# Patient Record
Sex: Female | Born: 1975
Health system: Southern US, Community
[De-identification: ages and names within clinical notes are randomized; demographics above are authoritative.]

## PROBLEM LIST (undated history)

## (undated) DIAGNOSIS — I10 Essential (primary) hypertension: Secondary | ICD-10-CM

## (undated) DIAGNOSIS — E039 Hypothyroidism, unspecified: Secondary | ICD-10-CM

## (undated) DIAGNOSIS — E785 Hyperlipidemia, unspecified: Secondary | ICD-10-CM

## (undated) DIAGNOSIS — E119 Type 2 diabetes mellitus without complications: Secondary | ICD-10-CM

## (undated) HISTORY — DX: Type 2 diabetes mellitus without complications: E11.9

## (undated) HISTORY — DX: Essential (primary) hypertension: I10

## (undated) HISTORY — DX: Hyperlipidemia, unspecified: E78.5

## (undated) HISTORY — PX: WISDOM TOOTH EXTRACTION: SHX21

## (undated) HISTORY — DX: Hypothyroidism, unspecified: E03.9

---

## 1999-12-06 ENCOUNTER — Inpatient Hospital Stay (HOSPITAL_COMMUNITY): Admission: AD | Admit: 1999-12-06 | Discharge: 1999-12-09 | Payer: Self-pay | Admitting: Obstetrics and Gynecology

## 2000-02-26 HISTORY — PX: TUBAL LIGATION: SHX77

## 2000-03-28 ENCOUNTER — Ambulatory Visit (HOSPITAL_COMMUNITY): Admission: RE | Admit: 2000-03-28 | Discharge: 2000-03-28 | Payer: Self-pay

## 2001-02-09 ENCOUNTER — Other Ambulatory Visit: Admission: RE | Admit: 2001-02-09 | Discharge: 2001-02-09 | Payer: Self-pay | Admitting: Obstetrics and Gynecology

## 2002-03-15 ENCOUNTER — Other Ambulatory Visit: Admission: RE | Admit: 2002-03-15 | Discharge: 2002-03-15 | Payer: Self-pay | Admitting: Obstetrics and Gynecology

## 2003-03-30 ENCOUNTER — Other Ambulatory Visit: Admission: RE | Admit: 2003-03-30 | Discharge: 2003-03-30 | Payer: Self-pay | Admitting: Obstetrics and Gynecology

## 2004-05-01 ENCOUNTER — Other Ambulatory Visit: Admission: RE | Admit: 2004-05-01 | Discharge: 2004-05-01 | Payer: Self-pay | Admitting: Obstetrics and Gynecology

## 2004-05-21 ENCOUNTER — Ambulatory Visit (HOSPITAL_COMMUNITY): Admission: RE | Admit: 2004-05-21 | Discharge: 2004-05-21 | Payer: Self-pay | Admitting: Internal Medicine

## 2004-12-14 ENCOUNTER — Ambulatory Visit: Payer: Self-pay | Admitting: Internal Medicine

## 2004-12-31 ENCOUNTER — Ambulatory Visit: Payer: Self-pay | Admitting: Internal Medicine

## 2005-06-10 ENCOUNTER — Other Ambulatory Visit: Admission: RE | Admit: 2005-06-10 | Discharge: 2005-06-10 | Payer: Self-pay | Admitting: Obstetrics and Gynecology

## 2005-07-13 IMAGING — US US SOFT TISSUE HEAD/NECK
1 series · 14 of 25 positions shown · non-contrast
Comparison: none

CLINICAL DATA: Right thyroid enlargement. 
 ULTRASOUND OF THE THYROID
 Multiplanar imaging shows overall size of the gland to be upper limits of normal to slightly enlarged.  The right lobe measures 5.0 x 1.4 x 1.6 cm.  The left is 4.5 x 1.3 x 1.3 cm.  
 The gland is generally inhomogeneous.  There is a single discreet nodule demonstrates.  This is an Tekoriene structure in the lower pole on the right in the inferior aspect of the gland.  It is solid and measures 12 x 7 x 9 mm.
 The borderline enlargement and inhomogeneity of the gland suggests that this may be an early multinodular goiter.  One might consider a follow-up exam in 6 months to 1 year to assess stability of the nodule demonstrated.

[Series 1: unknown · 0.09mm/px · 14 of 41 slices shown]
[im 1/41]
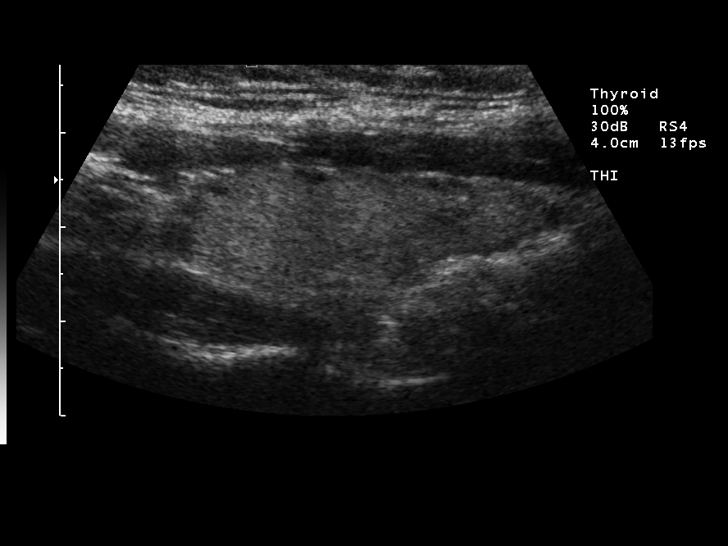
[im 4/41]
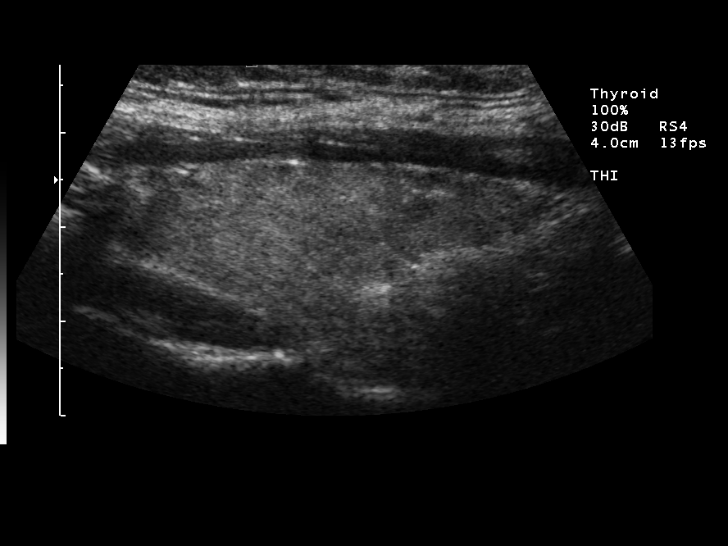
[im 7/41]
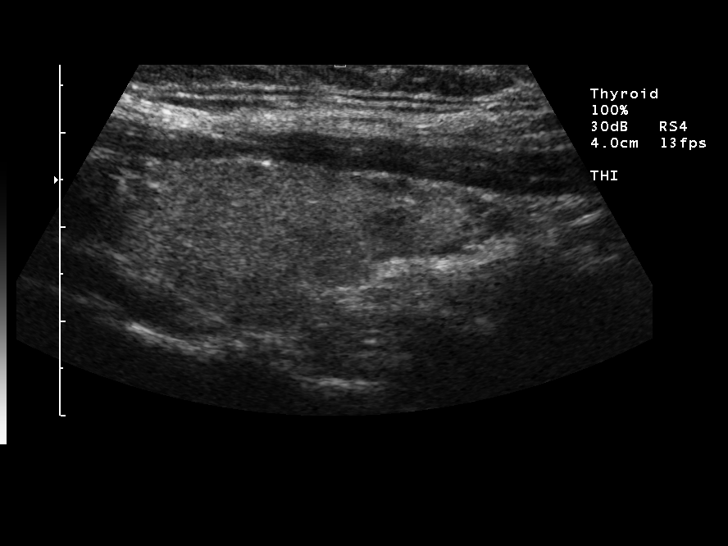
[im 11/41]
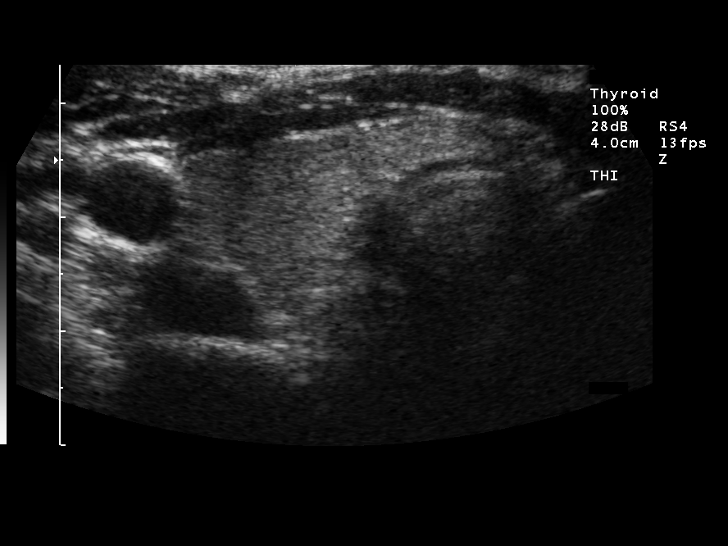
[im 14/41]
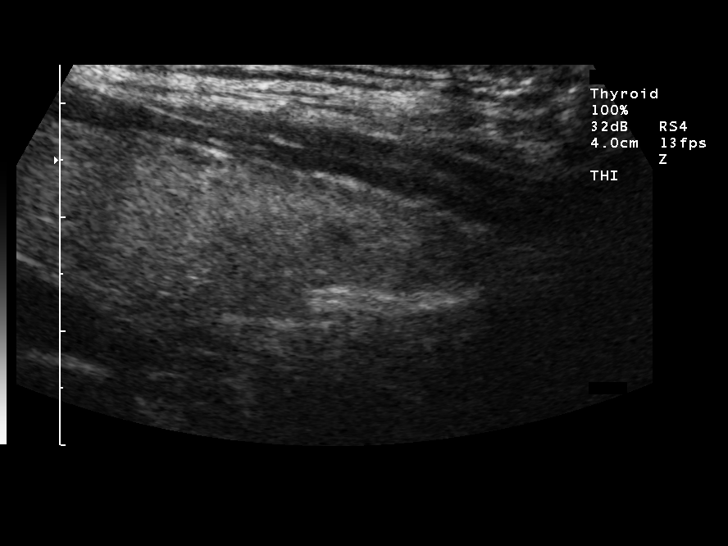
[im 16/41]
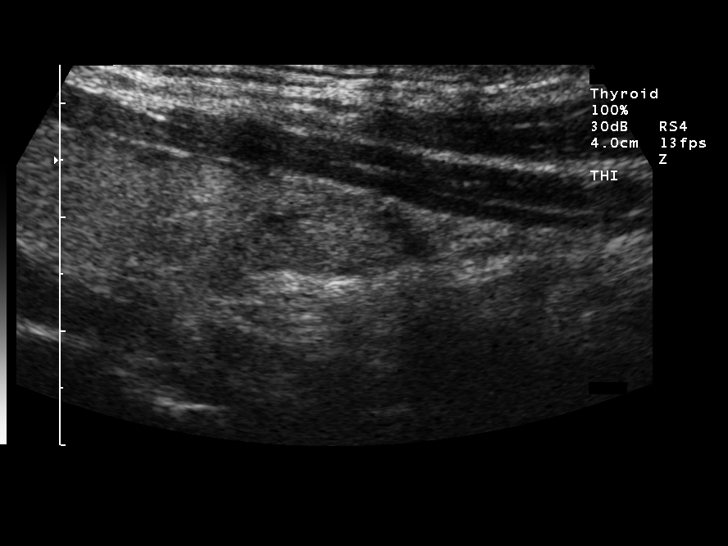
[im 19/41]
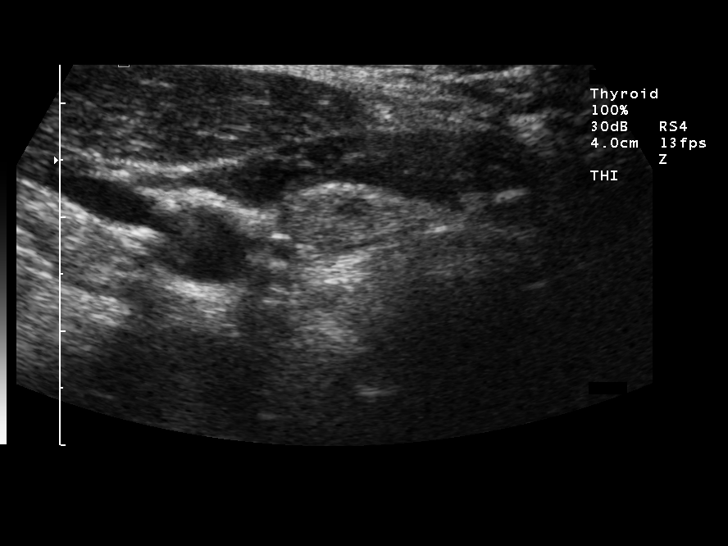
[im 22/41]
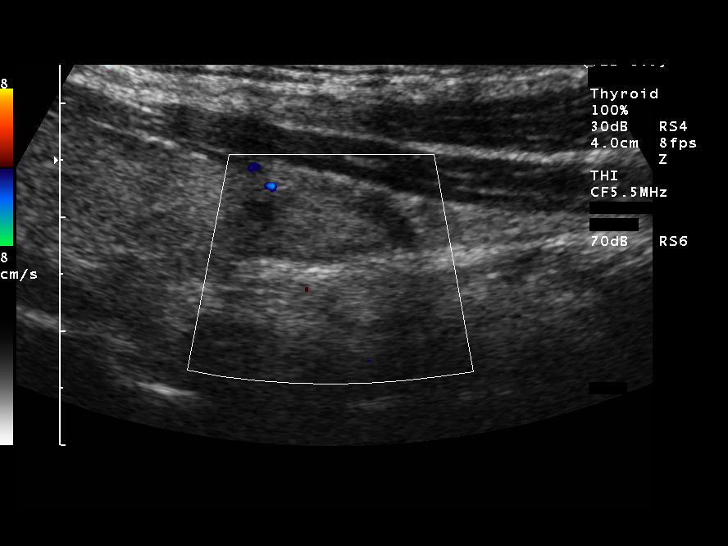
[im 26/41]
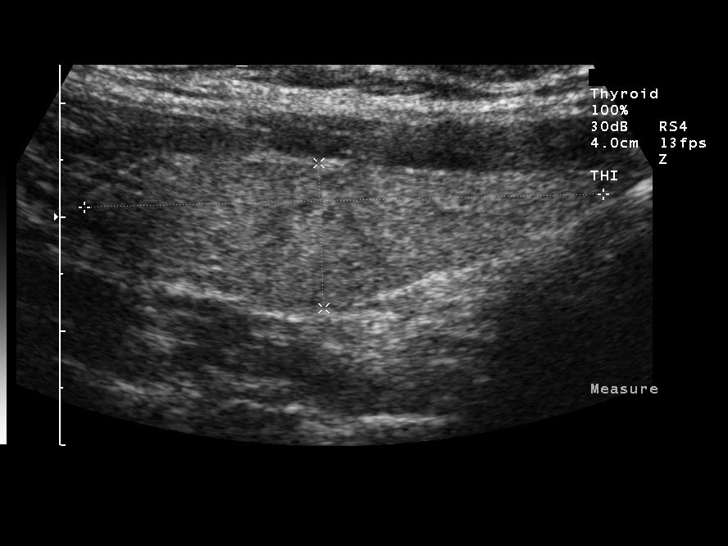
[im 27/41]
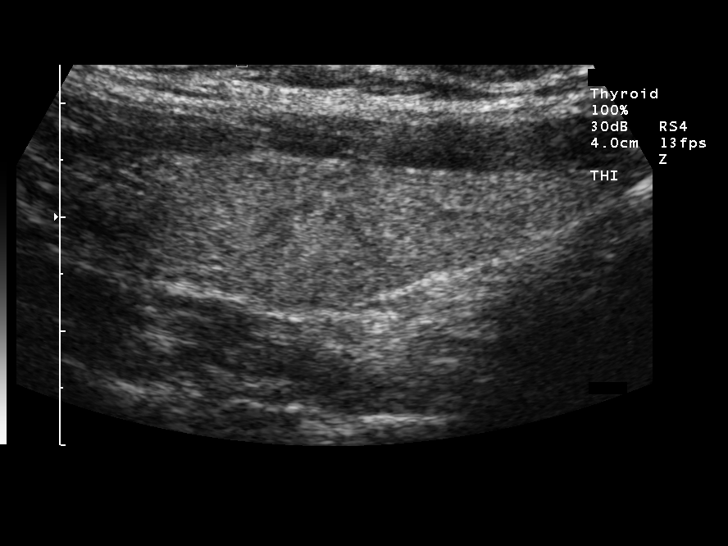
[im 31/41]
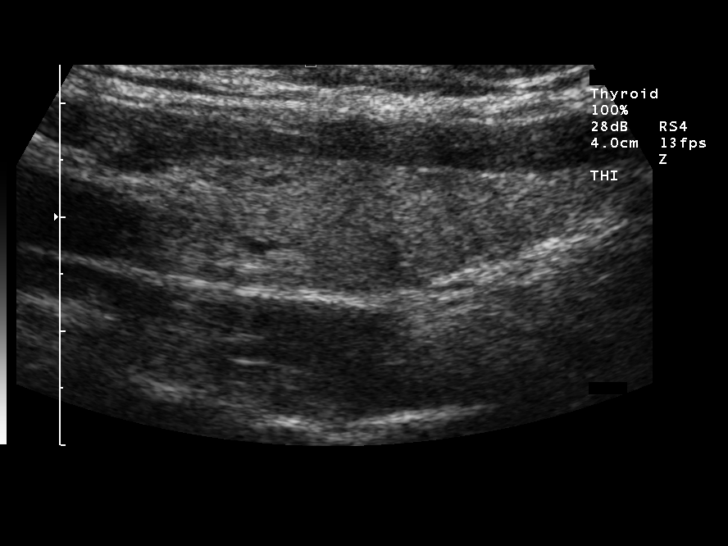
[im 34/41]
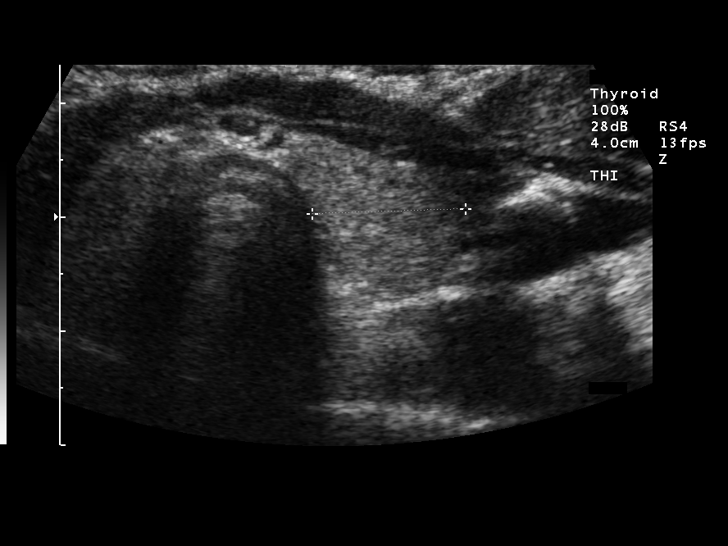
[im 37/41]
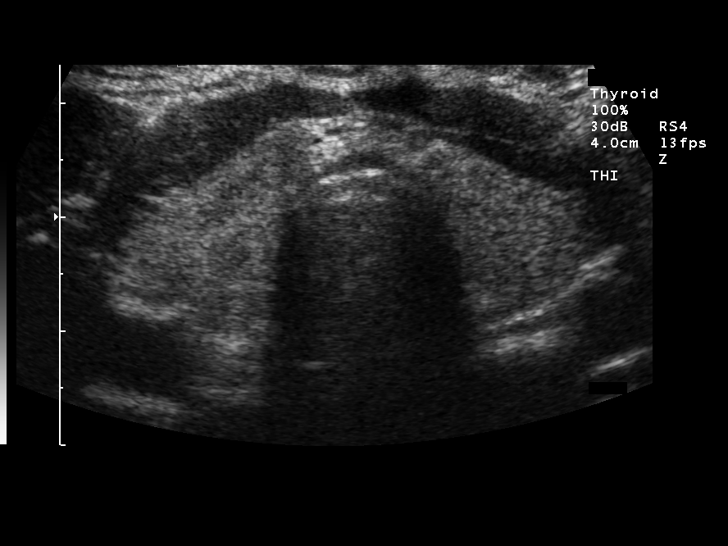
[im 41/41]
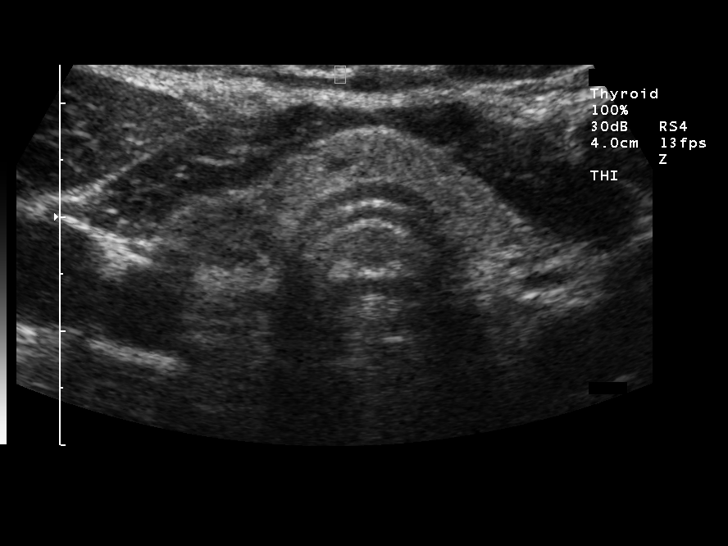

[14 of 25 positions shown; findings below may reference images not displayed]

IMPRESSION: Borderline enlarged, inhomogeneous gland with a single solid nodule in the inferior aspect of the right lobe.  See above discussion.

## 2007-06-10 ENCOUNTER — Ambulatory Visit: Payer: Self-pay | Admitting: Internal Medicine

## 2007-06-10 LAB — CONVERTED CEMR LAB
Alkaline Phosphatase: 44 units/L (ref 39–117)
BUN: 12 mg/dL (ref 6–23)
Basophils Relative: 0.6 % (ref 0.0–1.0)
Calcium: 9.2 mg/dL (ref 8.4–10.5)
Cholesterol: 184 mg/dL (ref 0–200)
GFR calc non Af Amer: 102 mL/min
Glucose, Bld: 108 mg/dL — ABNORMAL HIGH (ref 70–99)
HCT: 40.1 % (ref 36.0–46.0)
HDL: 38.1 mg/dL — ABNORMAL LOW (ref >39.0)
Hemoglobin: 13.8 g/dL (ref 12.0–15.0)
Ketones, ur: NEGATIVE mg/dL
Leukocytes, UA: NEGATIVE
Neutrophils Relative %: 43.1 % (ref 43.0–77.0)
Platelets: 194 10*3/uL (ref 150–400)
RBC: 4.48 M/uL (ref 3.87–5.11)
Sodium: 140 meq/L (ref 135–145)
TSH: 4.34 microintl units/mL (ref 0.35–5.50)
Total Bilirubin: 0.7 mg/dL (ref 0.3–1.2)
Triglycerides: 127 mg/dL (ref 0–149)
Urobilinogen, UA: 0.2 (ref 0.0–1.0)
VLDL: 25 mg/dL (ref 0–40)
pH: 5.5 (ref 5.0–8.0)

## 2007-06-15 ENCOUNTER — Ambulatory Visit: Payer: Self-pay | Admitting: Internal Medicine

## 2008-10-12 ENCOUNTER — Ambulatory Visit: Payer: Self-pay | Admitting: Internal Medicine

## 2008-10-12 DIAGNOSIS — L255 Unspecified contact dermatitis due to plants, except food: Secondary | ICD-10-CM

## 2008-10-18 ENCOUNTER — Telehealth (INDEPENDENT_AMBULATORY_CARE_PROVIDER_SITE_OTHER): Payer: Self-pay | Admitting: *Deleted

## 2008-11-07 ENCOUNTER — Ambulatory Visit: Payer: Self-pay | Admitting: Internal Medicine

## 2008-11-07 DIAGNOSIS — I10 Essential (primary) hypertension: Secondary | ICD-10-CM | POA: Insufficient documentation

## 2008-11-09 ENCOUNTER — Ambulatory Visit: Payer: Self-pay | Admitting: Internal Medicine

## 2008-11-10 LAB — CONVERTED CEMR LAB
AST: 23 units/L (ref 0–37)
Albumin: 3.9 g/dL (ref 3.5–5.2)
Alkaline Phosphatase: 51 units/L (ref 39–117)
BUN: 13 mg/dL (ref 6–23)
Basophils Absolute: 0.3 10*3/uL — ABNORMAL HIGH (ref 0.0–0.1)
Basophils Relative: 3.3 % — ABNORMAL HIGH (ref 0.0–3.0)
Bilirubin, Direct: 0.1 mg/dL (ref 0.0–0.3)
Chloride: 105 meq/L (ref 96–112)
Crystals: NEGATIVE
Glucose, Bld: 114 mg/dL — ABNORMAL HIGH (ref 70–99)
HDL: 53.5 mg/dL (ref >39.0)
Hemoglobin: 13.3 g/dL (ref 12.0–15.0)
Hgb A1c MFr Bld: 5.8 % (ref 4.6–6.0)
Leukocytes, UA: NEGATIVE
Monocytes Relative: 9.1 % (ref 3.0–12.0)
Neutro Abs: 4.3 10*3/uL (ref 1.4–7.7)
Potassium: 4.3 meq/L (ref 3.5–5.1)
RBC: 4.07 M/uL (ref 3.87–5.11)
RDW: 12.2 % (ref 11.5–14.6)
TSH: 4.65 microintl units/mL (ref 0.35–5.50)
Total CHOL/HDL Ratio: 3.6
Total Protein, Urine: 30 mg/dL — AB
Total Protein: 7.1 g/dL (ref 6.0–8.3)
Triglycerides: 53 mg/dL (ref 0–149)
Urobilinogen, UA: 0.2 (ref 0.0–1.0)
WBC: 9.2 10*3/uL (ref 4.5–10.5)
pH: 5.5 (ref 5.0–8.0)

## 2008-12-07 ENCOUNTER — Telehealth (INDEPENDENT_AMBULATORY_CARE_PROVIDER_SITE_OTHER): Payer: Self-pay | Admitting: *Deleted

## 2009-01-10 ENCOUNTER — Encounter: Payer: Self-pay | Admitting: Internal Medicine

## 2009-05-18 ENCOUNTER — Telehealth (INDEPENDENT_AMBULATORY_CARE_PROVIDER_SITE_OTHER): Payer: Self-pay | Admitting: *Deleted

## 2010-12-24 ENCOUNTER — Ambulatory Visit (INDEPENDENT_AMBULATORY_CARE_PROVIDER_SITE_OTHER): Payer: 59 | Admitting: Internal Medicine

## 2010-12-24 ENCOUNTER — Other Ambulatory Visit: Payer: Self-pay | Admitting: Internal Medicine

## 2010-12-24 ENCOUNTER — Encounter: Payer: Self-pay | Admitting: Internal Medicine

## 2010-12-24 DIAGNOSIS — Z0000 Encounter for general adult medical examination without abnormal findings: Secondary | ICD-10-CM

## 2010-12-24 DIAGNOSIS — R7309 Other abnormal glucose: Secondary | ICD-10-CM

## 2010-12-24 DIAGNOSIS — E785 Hyperlipidemia, unspecified: Secondary | ICD-10-CM

## 2010-12-25 LAB — URINALYSIS
Bilirubin Urine: NEGATIVE
Ketones, ur: NEGATIVE
Nitrite: NEGATIVE
Specific Gravity, Urine: >=1.03 (ref 1.000–1.030)
Total Protein, Urine: NEGATIVE
Urine Glucose: NEGATIVE
pH: 5.5 (ref 5.0–8.0)

## 2010-12-25 LAB — HEPATIC FUNCTION PANEL
ALT: 68 U/L — ABNORMAL HIGH (ref 0–35)
Alkaline Phosphatase: 58 U/L (ref 39–117)
Bilirubin, Direct: 0 mg/dL (ref 0.0–0.3)
Total Bilirubin: 0.5 mg/dL (ref 0.3–1.2)

## 2010-12-25 LAB — CBC WITH DIFFERENTIAL/PLATELET
Basophils Absolute: 0 10*3/uL (ref 0.0–0.1)
Basophils Relative: 0.3 % (ref 0.0–3.0)
Lymphs Abs: 2.8 10*3/uL (ref 0.7–4.0)
MCHC: 34.9 g/dL (ref 30.0–36.0)
MCV: 92.6 fl (ref 78.0–100.0)
Neutro Abs: 4.2 10*3/uL (ref 1.4–7.7)
Neutrophils Relative %: 53.2 % (ref 43.0–77.0)
Platelets: 186 10*3/uL (ref 150.0–400.0)
RDW: 12.5 % (ref 11.5–14.6)

## 2010-12-25 LAB — LIPID PANEL
HDL: 40 mg/dL (ref >39.00)
Total CHOL/HDL Ratio: 5

## 2010-12-25 LAB — BASIC METABOLIC PANEL
Creatinine, Ser: 0.8 mg/dL (ref 0.4–1.2)
GFR: 91.08 mL/min (ref >60.00)

## 2011-01-03 NOTE — Assessment & Plan Note (Signed)
Summary: FU ON MEDS /NWS   Vital Signs:  Patient profile:   35 year old female Height:      64 inches Weight:      246 pounds BMI:     42.38 O2 Sat:      98 % on Room air Temp:     98.4 degrees F oral Pulse rate:   83 / minute BP sitting:   120 / 90  (right arm) Cuff size:   large  Vitals Entered By: Zella Ball Ewing CMA Duncan Dull) (December 24, 2010 8:18 AM)  O2 Flow:  Room air  Preventive Care Screening     declines tetanus  CC: followup on medications/RE   CC:  followup on medications/RE.  History of Present Illness: here for wellness and f/u - overall doing ok, but BP at home mild > 140.90;  Pt denies CP, worsening sob, doe, wheezing, orthopnea, pnd, worsening LE edema, palps, dizziness or syncope  Pt denies new neuro symptoms such as headache, facial or extremity weakness  Pt denies polydipsia, polyuria,  Overall good compliance with meds, trying to follow low chol diet, wt stable, little excercise however . Overall good compliance with meds, and good tolerability. No fever, wt loss, night sweats, loss of appetite or other constitutional symptoms  Denies worsening depressive symptoms, suicidal ideation, or panic.   Pt states good ability with ADL's, low fall risk, home safety reviewed and adequate, no significant change in hearing or vision, trying to follow lower chol diet, and occasionally active only with regular excercise.   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 1po Once Daily 2)  Amlodipine Besylate 5 Mg Tabs (Amlodipine Besylate) .Marland Kitchen.. 1 By Mouth Once Daily  Current Medications (verified): 1)  Azor 5-20 Mg Tabs (Amlodipine-Olmesartan) .Marland Kitchen.. 1po Once Daily  Allergies (verified): No Known Drug Allergies  Past History:  Past Medical History: Last updated:  11/07/2008 Hypertension  Past Surgical History: Last updated: 11/07/2008 Tubal ligation - 5/01  Family History: Last updated: 11/07/2008 HTN - grandmother , and pituitary tumore  Social History: Last updated: 10/12/2008 Married Never Smoked Alcohol use-no Drug use-no Regular exercise-no  Risk Factors: Exercise: no (10/12/2008)  Risk Factors: Smoking Status: never (10/12/2008) Passive Smoke Exposure: no (10/12/2008)  Review of Systems  The patient denies anorexia, fever, vision loss, decreased hearing, hoarseness, chest pain, syncope, dyspnea on exertion, peripheral edema, prolonged cough, headaches, hemoptysis, abdominal pain, melena, hematochezia, severe indigestion/heartburn, hematuria, muscle weakness, suspicious skin lesions, transient blindness, difficulty walking, depression, unusual weight change, abnormal bleeding, enlarged lymph nodes, and angioedema.         all otherwise negative per pt -    Physical Exam  General:  alert and overweight-appearing.   Head:  Normocephalic and atraumatic without obvious abnormalities. No apparent alopecia or balding. Eyes:  No corneal or conjunctival inflammation noted. EOMI. Perrla. Ears:  R ear normal and L ear normal.   Nose:  no external deformity and no nasal discharge.   Mouth:  no gingival abnormalities and pharynx pink and moist.   Neck:  supple and no masses.   Lungs:  normal respiratory effort and normal breath sounds.   Heart:  Normal rate and regular rhythm. S1 and S2 normal without gallop, murmur, click, rub or other extra sounds. Abdomen:  soft, non-tender, and normal bowel sounds.  Msk:  no joint tenderness and no joint swelling.   Extremities:  no edema, no erythema  Neurologic:  strength normal in all extremities and gait normal.   Skin:  color normal and no rashes.   Psych:  not depressed appearing and slightly anxious.     Impression & Recommendations:  Problem # 1:  Preventive Health Care  (ICD-V70.0) Overall doing well, age appropriate education and counseling updated, referral for preventive services and immunizations addressed, dietary counseling and smoking status adressed , most recent labs reviewed I have personally reviewed and have noted 1.The patient's medical and social history 2.Their use of alcohol, tobacco or illicit drugs 3.Their current medications and supplements 4. Functional ability including ADL's, fall risk, home safety risk, hearing & visual impairment  5.Diet and physical activities 6.Evidence for depression or mood disorders The patients weight, height, BMI  have been recorded in the chart I have made referrals, counseling and provided education to the patient based review of the above  Orders: TLB-BMP (Basic Metabolic Panel-BMET) (80048-METABOL) TLB-CBC Platelet - w/Differential (85025-CBCD) TLB-Hepatic/Liver Function Pnl (80076-HEPATIC) TLB-Lipid Panel (80061-LIPID) TLB-TSH (Thyroid Stimulating Hormone) (84443-TSH) TLB-Udip ONLY (81003-UDIP)  Problem # 2:  HYPERTENSION (ICD-401.9)  The following medications were removed from the medication list:    Cozaar 100 Mg Tabs (Losartan potassium) .Marland Kitchen... 1po once daily Her updated medication list for this problem includes:    Azor 5-20 Mg Tabs (Amlodipine-olmesartan) .Marland Kitchen... 1po once daily  BP today: 120/90 Prior BP: 136/100 (11/07/2008)  Labs Reviewed: K+: 4.3 (11/09/2008) Creat: : 0.8 (11/09/2008)   Chol: 190 (11/09/2008)   HDL: 53.5 (11/09/2008)   LDL: 126 (11/09/2008)   TG: 53 (11/09/2008) stable overall by hx and exam, ok to continue meds/tx as is   Complete Medication List: 1)  Azor 5-20 Mg Tabs (Amlodipine-olmesartan) .Marland Kitchen.. 1po once daily  Patient Instructions: 1)  Please take all new medications as prescribed 2)  Continue all previous medications as before this visit  3)  Please go to the Lab in the basement for your blood and/or urine tests today 4)  Please call the number on the [facility]  for results of your testing  5)  Please schedule a follow-up appointment in 3 months. 6)  Check your Blood Pressure regularly. If it is above 140.90: you should make an appointment or call sooner Prescriptions: AZOR 5-20 MG TABS (AMLODIPINE-OLMESARTAN) 1po once daily  #30 x 11   Entered and Authorized by:   Corwin Levins MD   Signed by:   Corwin Levins MD on 12/24/2010   Method used:   Print then Give to Patient   RxID:   [id number] AZOR 5-20 MG TABS (AMLODIPINE-OLMESARTAN) 1po once daily  #90 x 3   Entered and Authorized by:   Corwin Levins MD   Signed by:   Corwin Levins MD on 12/24/2010   Method used:   Print then Give to Patient   RxID:   [id number]    Orders Added: 1)  TLB-BMP (Basic Metabolic Panel-BMET) [80048-METABOL] 2)  TLB-CBC Platelet - w/Differential [85025-CBCD] 3)  TLB-Hepatic/Liver Function Pnl [80076-HEPATIC] 4)  TLB-Lipid Panel [80061-LIPID] 5)  TLB-TSH (Thyroid Stimulating Hormone) [84443-TSH] 6)  TLB-Udip ONLY [81003-UDIP] 7)  Est. Patient 18-39 years [95284]

## 2011-03-15 NOTE — Op Note (Signed)
[facility]  Patient:    Michelle Davis, POZO                    MRN: [id number] Proc. Date: 03/28/00 Adm. Date:  [id number] Disc. Date: [id number] Attending:  Cordelia Pen Ii                           Operative Report  PREOPERATIVE DIAGNOSIS:       Desires permanent sterilization.  POSTOPERATIVE DIAGNOSIS:      Desires permanent sterilization.  OPERATION:                    Laparoscopy with Filshie clip application.  SURGEON:                      Guy Sandifer. Arleta [place], M.D.  ASSISTANT:  ANESTHESIA:                   General endotracheal anesthesia by Gretta Cool., M.D.  ESTIMATED BLOOD LOSS:         Drops.  INDICATIONS:                  This patient is a 35 year old married white female, gravida 2, para 2, who desires permanent sterilization.  Details are dictated in the history and physical.  Laparoscopy with Filshie clip application is discussed. Possible risks and complications are discussed including, but not limited to, infection, bowel, bladder, or ureteral damage, bleeding requiring transfusion of blood products with possible transfusion reaction, HIV, and hepatitis acquisition, DVT, PE, failure rate, increased ectopic risk, and permanence of the procedure.  Consent is signed and on the chart.  FINDINGS:                     Upper abdomen is normal.  The uterus, tubes, ovaries, anterior and posterior cul-de-sac, and side walls are normal.  DESCRIPTION OF PROCEDURE:     The patient is taken to the operating room and placed in the dorsal supine position where general anesthesia is induced via endotracheal intubation.  She is then placed in the dorsal lithotomy position where she is prepped abdominally and vaginally.  The bladder is straight catheterized. Hulka tenaculum was placed in the uterus as a manipulator and she is draped in a sterile fashion.  A small infraumbilical incision is made and a 10/11 disposable  trocar  sleeve is placed on the first attempt without difficulty.  Placement was verified with the laparoscope and no damage to surrounding structures was noted.  The pneumoperitoneum was induced.  A small suprapubic incision was made and a 5 mm nondisposable trocar sleeve is placed under direct visualization without difficulty.  The above findings were noted.  The Filshie clip applicator is then backloaded through the operative laparoscope and the right fallopian tube is identified from cornua to fimbria and grasped and elevated.  Filshie clip is then placed on the proximal segment of the fallopian tube.  A similar procedure is carried out on the left fallopian tube.  Filshie clip applicator is removed and  close inspection reveals that the clips surround the entire width of the tube and the heel of the clip can be seen through the mesosalpinx bilaterally.  The suprapubic trocar sleeve is removed, the pneumoperitoneum is reduced, and no bleeding is noted from any site.  The pneumoperitoneum is completely reduced. Umbilical trocar sleeve is removed,  and the skin incisions are closed with subcuticular 3-0 Vicryl suture.  The incisions are injected with 0.5% plain Marcaine, dressings are applied, Hulka tenaculum is removed, and no bleeding is  noted.  All counts are correct.  The patient is transferred to the recovery room in stable condition. DD:  03/28/00 TD:  03/31/00 Job: [id number]

## 2012-01-03 ENCOUNTER — Other Ambulatory Visit: Payer: Self-pay | Admitting: Internal Medicine

## 2012-01-07 ENCOUNTER — Encounter: Payer: Self-pay | Admitting: Endocrinology

## 2012-01-07 ENCOUNTER — Ambulatory Visit (INDEPENDENT_AMBULATORY_CARE_PROVIDER_SITE_OTHER): Payer: 59 | Admitting: Endocrinology

## 2012-01-07 VITALS — BP 136/92 | HR 75 | Temp 98.3°F | Ht 64.0 in | Wt 250.0 lb

## 2012-01-07 DIAGNOSIS — L255 Unspecified contact dermatitis due to plants, except food: Secondary | ICD-10-CM

## 2012-01-07 MED ORDER — METHYLPREDNISOLONE SODIUM SUCC 125 MG IJ SOLR
125.0000 mg | Freq: Once | INTRAMUSCULAR | Status: AC
  Administered 2012-01-07: 125 mg via INTRAMUSCULAR

## 2012-01-07 MED ORDER — TRIAMCINOLONE ACETONIDE 0.1 % EX CREA: TOPICAL_CREAM | Freq: Three times a day (TID) | CUTANEOUS | Status: AC

## 2012-01-07 NOTE — Patient Instructions (Signed)
i have sent a prescription to your pharmacy, for a steroid cream. I hope you feel better soon.  If you don't feel better by next week, please call back.

## 2012-01-07 NOTE — Progress Notes (Signed)
  Subjective:    Patient ID: Michelle Davis, female    DOB: 1976-04-08, 36 y.o.   MRN: [id number]  HPI Pt states few days of slight rash at the forearms and forehead, and assoc itching.   Past Medical History  Diagnosis Date  . Hypertension     Past Surgical History  Procedure Date  . Tubal ligation 02/2000    History   Social History  . Marital Status: Married    Spouse Name: N/A    Number of Children: N/A  . Years of Education: N/A   Occupational History  . Not on file.   Social History Main Topics  . Smoking status: Never Smoker   . Smokeless tobacco: Not on file  . Alcohol Use: No  . Drug Use: No  . Sexually Active: Not on file   Other Topics Concern  . Not on file   Social History Narrative   Regular exercise-no    Current Outpatient Prescriptions on File Prior to Visit  Medication Sig Dispense Refill  . AZOR 5-20 MG per tablet TAKE 1 TABLET BY MOUTH ONCE DAILY  30 tablet  1    No Known Allergies  Family History  Problem Relation Age of Onset  . Hypertension Other     Grandmother-and pituitary tumor    BP 136/92  Pulse 75  Temp(Src) 98.3 F (36.8 C) (Oral)  Ht 5\' 4"  (1.626 m)  Wt 250 lb (113.399 kg)  BMI 42.91 kg/m2  SpO2 97%  LMP 01/07/2012    Review of Systems Denies fever.    Objective:   Physical Exam VITAL SIGNS:  See vs page.   GENERAL: no distress. Skin: there is a mild eczematous rash at these areas.      Solumedrol 125 mg IM    Assessment & Plan:  Contact dermatitis, new

## 2012-05-06 ENCOUNTER — Other Ambulatory Visit: Payer: Self-pay | Admitting: Internal Medicine

## 2012-05-06 ENCOUNTER — Telehealth: Payer: Self-pay

## 2012-05-06 NOTE — Telephone Encounter (Signed)
Patient called requesting samples of BP med awaiting PA to be completed. Patient informed to pickup samples today at her convenience. Patient received 8 boxes of Azor 5/20 #7 in each box.

## 2012-05-07 ENCOUNTER — Telehealth: Payer: Self-pay

## 2012-05-07 MED ORDER — IRBESARTAN 150 MG PO TABS: 150.0000 mg | ORAL_TABLET | Freq: Every day | ORAL | Status: DC

## 2012-05-07 MED ORDER — AMLODIPINE BESYLATE 5 MG PO TABS: 5.0000 mg | ORAL_TABLET | Freq: Every day | ORAL | Status: DC

## 2012-05-07 NOTE — Telephone Encounter (Signed)
Received PA for Azor 5/20 please advise if to proceed or offer alternative.  Patient does have samples of the Azor at this time as she was completely out.

## 2012-05-07 NOTE — Telephone Encounter (Signed)
Ok to change to amlodipine 5, and avapro 150 qd - both generic (which together is approx equal to the azor 5-20)

## 2012-05-08 NOTE — Telephone Encounter (Signed)
Patient informed. 

## 2012-05-08 NOTE — Telephone Encounter (Signed)
Called left message to call back 

## 2012-09-16 ENCOUNTER — Ambulatory Visit (INDEPENDENT_AMBULATORY_CARE_PROVIDER_SITE_OTHER): Payer: 59 | Admitting: Internal Medicine

## 2012-09-16 ENCOUNTER — Encounter: Payer: Self-pay | Admitting: Internal Medicine

## 2012-09-16 VITALS — BP 120/86 | HR 88 | Temp 97.7°F | Ht 63.0 in | Wt 248.0 lb

## 2012-09-16 DIAGNOSIS — M545 Low back pain, unspecified: Secondary | ICD-10-CM | POA: Insufficient documentation

## 2012-09-16 DIAGNOSIS — Z0000 Encounter for general adult medical examination without abnormal findings: Secondary | ICD-10-CM | POA: Insufficient documentation

## 2012-09-16 DIAGNOSIS — Z0001 Encounter for general adult medical examination with abnormal findings: Secondary | ICD-10-CM | POA: Insufficient documentation

## 2012-09-16 LAB — POCT URINALYSIS DIPSTICK
Ketones, UA: NEGATIVE
Leukocytes, UA: NEGATIVE
Urobilinogen, UA: 0.2
pH, UA: 7

## 2012-09-16 MED ORDER — CYCLOBENZAPRINE HCL 5 MG PO TABS: 5.0000 mg | ORAL_TABLET | Freq: Three times a day (TID) | ORAL | Status: DC | PRN

## 2012-09-16 MED ORDER — PREDNISONE 10 MG PO TABS: ORAL_TABLET | ORAL | Status: DC

## 2012-09-16 MED ORDER — TRAMADOL HCL 50 MG PO TABS: 50.0000 mg | ORAL_TABLET | Freq: Four times a day (QID) | ORAL | Status: DC | PRN

## 2012-09-16 NOTE — Patient Instructions (Addendum)
Take all new medications as prescribed Continue all other medications as before Please go to LAB in the Basement for the blood and/or urine tests to be done at your convenience You will be contacted by phone if any changes need to be made immediately.  Otherwise, you will receive a letter about your results with an explanation, but please check with MyChart first. Thank you for enrolling in MyChart. Please follow the instructions below to securely access your online medical record. MyChart allows you to send messages to your doctor, view your test results, renew your prescriptions, schedule appointments, and more. To Log into MyChart, please go to [email or web address], and your Username is: [phone]

## 2012-09-16 NOTE — Assessment & Plan Note (Signed)
C/w msk strain most likely, for pain med/flexeril/predpack asd as cannot r/o underlying inflammation

## 2012-09-16 NOTE — Progress Notes (Signed)
Subjective:    Patient ID: Michelle Davis, female    DOB: July 05, 1976, 36 y.o.   MRN: [id number]  HPI Here for wellness and f/u;  Overall doing ok;  Pt denies CP, worsening SOB, DOE, wheezing, orthopnea, PND, worsening LE edema, palpitations, dizziness or syncope.  Pt denies neurological change such as new Headache, facial or extremity weakness.  Pt denies polydipsia, polyuria, or low sugar symptoms. Pt states overall good compliance with treatment and medications, good tolerability, and trying to follow lower cholesterol diet.  Pt denies worsening depressive symptoms, suicidal ideation or panic. No fever, wt loss, night sweats, loss of appetite, or other constitutional symptoms.  Pt states good ability with ADL's, low fall risk, home safety reviewed and adequate, no significant changes in hearing or vision, and occasionally active with exercise.  Pt c/o 2-3 days recurring left LBP without bowel or bladder change, fever, wt loss,  worsening LE pain/numbness/weakness, gait change or falls.  No prior hx of similar.  Worse to stand.  Has been lifting the grandchild much over the last few months.  C/o increaseed stress recently.   Denies urinary symptoms such as dysuria, frequency, urgency,or hematuria. Past Medical History  Diagnosis Date  . Hypertension    Past Surgical History  Procedure Date  . Tubal ligation 02/2000    reports that she has never smoked. She does not have any smokeless tobacco history on file. She reports that she does not drink alcohol or use illicit drugs. family history includes Hypertension in her other. No Known Allergies Current Outpatient Prescriptions on File Prior to Visit  Medication Sig Dispense Refill  . amLODipine (NORVASC) 5 MG tablet Take 1 tablet (5 mg total) by mouth daily.  90 tablet  3  . irbesartan (AVAPRO) 150 MG tablet Take 1 tablet (150 mg total) by mouth daily.  90 tablet  3  . triamcinolone cream (KENALOG) 0.1 % Apply topically 3 (three) times daily. As  needed for itching  30 g  0   Review of Systems Review of Systems  Constitutional: Negative for diaphoresis, activity change, appetite change and unexpected weight change.  HENT: Negative for hearing loss, ear pain, facial swelling, mouth sores and neck stiffness.   Eyes: Negative for pain, redness and visual disturbance.  Respiratory: Negative for shortness of breath and wheezing.   Cardiovascular: Negative for chest pain and palpitations.  Gastrointestinal: Negative for diarrhea, blood in stool, abdominal distention and rectal pain.  Genitourinary: Negative for hematuria, flank pain and decreased urine volume.  Musculoskeletal: Negative for myalgias and joint swelling.  Skin: Negative for color change and wound.  Neurological: Negative for syncope and numbness.  Hematological: Negative for adenopathy.  Psychiatric/Behavioral: Negative for hallucinations, self-injury, decreased concentration and agitation.      Objective:   Physical Exam BP 120/86  Pulse 88  Temp 97.7 F (36.5 C) (Oral)  Ht 5\' 3"  (1.6 m)  Wt 248 lb (112.492 kg)  BMI 43.93 kg/m2  SpO2 98% Physical Exam  VS noted Constitutional: Pt is oriented to person, place, and time. Appears well-developed and well-nourished.  HENT:  Head: Normocephalic and atraumatic.  Right Ear: External ear normal.  Left Ear: External ear normal.  Nose: Nose normal.  Mouth/Throat: Oropharynx is clear and moist.  Eyes: Conjunctivae and EOM are normal. Pupils are equal, round, and reactive to light.  Neck: Normal range of motion. Neck supple. No JVD present. No tracheal deviation present.  Cardiovascular: Normal rate, regular rhythm, normal heart sounds and intact distal  pulses.   Pulmonary/Chest: Effort normal and breath sounds normal.  Abdominal: Soft. Bowel sounds are normal. There is no tenderness.  Musculoskeletal: Normal range of motion. Exhibits no edema.  Lymphadenopathy:  Has no cervical adenopathy.  Neurological: Pt is alert  and oriented to person, place, and time. Pt has normal reflexes. No cranial nerve deficit. Motor/dtr/sens/gait intact Skin: Skin is warm and dry. No rash noted.  Psychiatric:  Has  normal mood and affect. Behavior is normal.  Spine: nontender except for left paravertebral area    Assessment & Plan:

## 2012-09-16 NOTE — Assessment & Plan Note (Signed)

## 2012-12-12 ENCOUNTER — Other Ambulatory Visit: Payer: Self-pay

## 2013-05-28 ENCOUNTER — Other Ambulatory Visit: Payer: Self-pay | Admitting: Internal Medicine

## 2013-09-02 ENCOUNTER — Other Ambulatory Visit: Payer: Self-pay

## 2014-06-22 ENCOUNTER — Other Ambulatory Visit: Payer: Self-pay | Admitting: Internal Medicine

## 2014-06-30 ENCOUNTER — Ambulatory Visit (INDEPENDENT_AMBULATORY_CARE_PROVIDER_SITE_OTHER): Payer: BC Managed Care – PPO | Admitting: Internal Medicine

## 2014-06-30 ENCOUNTER — Other Ambulatory Visit (INDEPENDENT_AMBULATORY_CARE_PROVIDER_SITE_OTHER): Payer: BC Managed Care – PPO

## 2014-06-30 ENCOUNTER — Encounter: Payer: Self-pay | Admitting: Internal Medicine

## 2014-06-30 VITALS — BP 120/82 | HR 83 | Temp 98.6°F | Ht 64.0 in | Wt 255.5 lb

## 2014-06-30 DIAGNOSIS — Z0000 Encounter for general adult medical examination without abnormal findings: Secondary | ICD-10-CM

## 2014-06-30 DIAGNOSIS — R7309 Other abnormal glucose: Secondary | ICD-10-CM

## 2014-06-30 DIAGNOSIS — I10 Essential (primary) hypertension: Secondary | ICD-10-CM

## 2014-06-30 DIAGNOSIS — E785 Hyperlipidemia, unspecified: Secondary | ICD-10-CM | POA: Insufficient documentation

## 2014-06-30 DIAGNOSIS — R7302 Impaired glucose tolerance (oral): Secondary | ICD-10-CM

## 2014-06-30 DIAGNOSIS — J309 Allergic rhinitis, unspecified: Secondary | ICD-10-CM | POA: Insufficient documentation

## 2014-06-30 DIAGNOSIS — E119 Type 2 diabetes mellitus without complications: Secondary | ICD-10-CM | POA: Insufficient documentation

## 2014-06-30 LAB — LIPID PANEL
Cholesterol: 232 mg/dL — ABNORMAL HIGH (ref 0–200)
HDL: 40.4 mg/dL (ref >39.00)
NonHDL: 191.6
TRIGLYCERIDES: 238 mg/dL — AB (ref 0.0–149.0)
Total CHOL/HDL Ratio: 6
VLDL: 47.6 mg/dL — ABNORMAL HIGH (ref 0.0–40.0)

## 2014-06-30 LAB — CBC WITH DIFFERENTIAL/PLATELET
BASOS ABS: 0.1 10*3/uL (ref 0.0–0.1)
BASOS PCT: 0.7 % (ref 0.0–3.0)
EOS PCT: 3.8 % (ref 0.0–5.0)
Eosinophils Absolute: 0.3 10*3/uL (ref 0.0–0.7)
HEMATOCRIT: 41.2 % (ref 36.0–46.0)
HEMOGLOBIN: 13.8 g/dL (ref 12.0–15.0)
LYMPHS ABS: 3.7 10*3/uL (ref 0.7–4.0)
LYMPHS PCT: 42.4 % (ref 12.0–46.0)
MCHC: 33.5 g/dL (ref 30.0–36.0)
MCV: 90.7 fl (ref 78.0–100.0)
MONOS PCT: 9.8 % (ref 3.0–12.0)
Monocytes Absolute: 0.9 10*3/uL (ref 0.1–1.0)
NEUTROS ABS: 3.8 10*3/uL (ref 1.4–7.7)
Neutrophils Relative %: 43.3 % (ref 43.0–77.0)
Platelets: 253 10*3/uL (ref 150.0–400.0)
RBC: 4.55 Mil/uL (ref 3.87–5.11)
RDW: 12.7 % (ref 11.5–15.5)
WBC: 8.8 10*3/uL (ref 4.0–10.5)

## 2014-06-30 LAB — URINALYSIS, ROUTINE W REFLEX MICROSCOPIC
Bilirubin Urine: NEGATIVE
HGB URINE DIPSTICK: NEGATIVE
Ketones, ur: NEGATIVE
NITRITE: NEGATIVE
SPECIFIC GRAVITY, URINE: 1.015 (ref 1.000–1.030)
Total Protein, Urine: NEGATIVE
URINE GLUCOSE: NEGATIVE
Urobilinogen, UA: 0.2 (ref 0.0–1.0)
pH: 8 (ref 5.0–8.0)

## 2014-06-30 LAB — BASIC METABOLIC PANEL
BUN: 9 mg/dL (ref 6–23)
CALCIUM: 9.4 mg/dL (ref 8.4–10.5)
CO2: 26 mEq/L (ref 19–32)
CREATININE: 0.7 mg/dL (ref 0.4–1.2)
Chloride: 104 mEq/L (ref 96–112)
GFR: 96.72 mL/min (ref >60.00)
GLUCOSE: 81 mg/dL (ref 70–99)
Potassium: 3.9 mEq/L (ref 3.5–5.1)
SODIUM: 139 meq/L (ref 135–145)

## 2014-06-30 LAB — TSH: TSH: 5.02 u[IU]/mL — ABNORMAL HIGH (ref 0.35–4.50)

## 2014-06-30 LAB — HEPATIC FUNCTION PANEL
ALBUMIN: 4.2 g/dL (ref 3.5–5.2)
ALT: 44 U/L — ABNORMAL HIGH (ref 0–35)
AST: 35 U/L (ref 0–37)
Alkaline Phosphatase: 57 U/L (ref 39–117)
Bilirubin, Direct: 0.1 mg/dL (ref 0.0–0.3)
Total Bilirubin: 0.6 mg/dL (ref 0.2–1.2)
Total Protein: 7.8 g/dL (ref 6.0–8.3)

## 2014-06-30 LAB — LDL CHOLESTEROL, DIRECT: Direct LDL: 172.9 mg/dL

## 2014-06-30 LAB — HEMOGLOBIN A1C: HEMOGLOBIN A1C: 6.4 % (ref 4.6–6.5)

## 2014-06-30 MED ORDER — FLUTICASONE PROPIONATE 50 MCG/ACT NA SUSP: 2.0000 | Freq: Every day | NASAL | Status: DC

## 2014-06-30 MED ORDER — IRBESARTAN 150 MG PO TABS: 150.0000 mg | ORAL_TABLET | Freq: Every day | ORAL | Status: DC

## 2014-06-30 MED ORDER — AMLODIPINE BESYLATE 5 MG PO TABS: 5.0000 mg | ORAL_TABLET | Freq: Every day | ORAL | Status: DC

## 2014-06-30 NOTE — Progress Notes (Signed)
Pre visit review using our clinic review tool, if applicable. No additional management support is needed unless otherwise documented below in the visit note. 

## 2014-06-30 NOTE — Patient Instructions (Addendum)
Please take all new medication as prescribed - the flonase for allergies  Please continue all other medications as before, and refills have been done if requested.  Please have the pharmacy call with any other refills you may need.  Please continue your efforts at being more active, low cholesterol diet, and weight control.  You are otherwise up to date with prevention measures today.  Please keep your appointments with your specialists as you may have planned  Please go to the LAB in the Basement (turn left off the elevator) for the tests to be done today  You will be contacted by phone if any changes need to be made immediately.  Otherwise, you will receive a letter about your results with an explanation, but please check with MyChart first.  Please remember to sign up for MyChart if you have not done so, as this will be important to you in the future with finding out test results, communicating by private email, and scheduling acute appointments online when needed.  Please return in 1 year for your yearly visit, or sooner if needed, with Lab testing done 3-5 days before

## 2014-06-30 NOTE — Progress Notes (Signed)
Subjective:    Patient ID: Michelle Davis, female    DOB: 16-Mar-1976, 38 y.o.   MRN: [id number]  HPI     Here for wellness and f/u;  Overall doing ok;  Pt denies CP, worsening SOB, DOE, wheezing, orthopnea, PND, worsening LE edema, palpitations, dizziness or syncope.  Pt denies neurological change such as new headache, facial or extremity weakness.  Pt denies polydipsia, polyuria, or low sugar symptoms. Pt states overall good compliance with treatment and medications, good tolerability, and has been trying to follow lower cholesterol diet.  Pt denies worsening depressive symptoms, suicidal ideation or panic. No fever, night sweats, wt loss, loss of appetite, or other constitutional symptoms.  Pt states good ability with ADL's, has low fall risk, home safety reviewed and adequate, no other significant changes in hearing or vision, and only occasionally active with exercise. Does have several wks ongoing nasal allergy symptoms with clearish congestion, itch and sneezing, without fever, pain, ST, cough, swelling or wheezing. No other complaints Past Medical History  Diagnosis Date  . Hypertension    Past Surgical History  Procedure Laterality Date  . Tubal ligation  02/2000    reports that she has never smoked. She does not have any smokeless tobacco history on file. She reports that she does not drink alcohol or use illicit drugs. family history includes Hypertension in her other. No Known Allergies No current outpatient prescriptions on file prior to visit.   No current facility-administered medications on file prior to visit.   Review of Systems Constitutional: Negative for increased diaphoresis, other activity, appetite or other siginficant weight change  HENT: Negative for worsening hearing loss, ear pain, facial swelling, mouth sores and neck stiffness.   Eyes: Negative for other worsening pain, redness or visual disturbance.  Respiratory: Negative for shortness of breath and  wheezing.   Cardiovascular: Negative for chest pain and palpitations.  Gastrointestinal: Negative for diarrhea, blood in stool, abdominal distention or other pain Genitourinary: Negative for hematuria, flank pain or change in urine volume.  Musculoskeletal: Negative for myalgias or other joint complaints.  Skin: Negative for color change and wound.  Neurological: Negative for syncope and numbness. other than noted Hematological: Negative for adenopathy. or other swelling Psychiatric/Behavioral: Negative for hallucinations, self-injury, decreased concentration or other worsening agitation.      Objective:   Physical Exam BP 120/82  Pulse 83  Temp(Src) 98.6 F (37 C) (Oral)  Ht 5\' 4"  (1.626 m)  Wt 255 lb 8 oz (115.894 kg)  BMI 43.83 kg/m2  SpO2 97% VS noted,  Constitutional: Pt is oriented to person, place, and time. Appears well-developed and well-nourished.  Head: Normocephalic and atraumatic.  Right Ear: External ear normal.  Left Ear: External ear normal.  Nose: Nose normal.  Mouth/Throat: Oropharynx is clear and moist.  Eyes: Conjunctivae and EOM are normal. Pupils are equal, round, and reactive to light.  Neck: Normal range of motion. Neck supple. No JVD present. No tracheal deviation present.  Cardiovascular: Normal rate, regular rhythm, normal heart sounds and intact distal pulses.   Pulmonary/Chest: Effort normal and breath sounds without rales or wheezing  Abdominal: Soft. Bowel sounds are normal. NT. No HSM  Musculoskeletal: Normal range of motion. Exhibits no edema.  Lymphadenopathy:  Has no cervical adenopathy.  Neurological: Pt is alert and oriented to person, place, and time. Pt has normal reflexes. No cranial nerve deficit. Motor grossly intact Skin: Skin is warm and dry. No rash noted.  Psychiatric:  Has normal mood  and affect. Behavior is normal.     Assessment & Plan:

## 2014-07-01 ENCOUNTER — Encounter: Payer: Self-pay | Admitting: Internal Medicine

## 2014-07-03 NOTE — Assessment & Plan Note (Signed)
Cont diet, f/u lipids.

## 2014-07-03 NOTE — Assessment & Plan Note (Signed)
For flonase asd,  to f/u any worsening symptoms or concerns

## 2014-07-03 NOTE — Assessment & Plan Note (Signed)
Also for a1c 

## 2014-07-03 NOTE — Assessment & Plan Note (Signed)

## 2014-07-03 NOTE — Assessment & Plan Note (Signed)
stable overall by history and exam, recent data reviewed with pt, and pt to continue medical treatment as before,  to f/u any worsening symptoms or concerns BP Readings from Last 3 Encounters:  06/30/14 120/82  09/16/12 120/86  01/07/12 136/92

## 2015-05-05 ENCOUNTER — Other Ambulatory Visit: Payer: Self-pay | Admitting: Internal Medicine

## 2015-05-05 NOTE — Telephone Encounter (Signed)
Last office visit sept/2015---please advise, thanks

## 2015-07-04 ENCOUNTER — Ambulatory Visit: Payer: BC Managed Care – PPO | Admitting: Internal Medicine

## 2015-08-10 ENCOUNTER — Other Ambulatory Visit: Payer: Self-pay | Admitting: Internal Medicine

## 2016-02-20 ENCOUNTER — Telehealth: Payer: Self-pay

## 2016-02-20 ENCOUNTER — Telehealth: Payer: Self-pay | Admitting: Internal Medicine

## 2016-02-20 MED ORDER — AMLODIPINE BESYLATE 5 MG PO TABS: 5.0000 mg | ORAL_TABLET | Freq: Every day | ORAL | Status: DC

## 2016-02-20 MED ORDER — IRBESARTAN 150 MG PO TABS: 150.0000 mg | ORAL_TABLET | Freq: Every day | ORAL | Status: DC

## 2016-02-20 NOTE — Telephone Encounter (Signed)
15 day supply has been sent to pharmacy for each medication until patient comes in on May 9th

## 2016-02-20 NOTE — Telephone Encounter (Signed)
I have worked patient in for CPE on 5/9.  Patient is currently out of amlodipine and irbesartan.  Is requesting refills to be sent to [facility] on [address].  Please follow up in regards.

## 2016-03-05 ENCOUNTER — Ambulatory Visit (INDEPENDENT_AMBULATORY_CARE_PROVIDER_SITE_OTHER): Payer: BLUE CROSS/BLUE SHIELD | Admitting: Internal Medicine

## 2016-03-05 ENCOUNTER — Encounter: Payer: Self-pay | Admitting: Internal Medicine

## 2016-03-05 VITALS — BP 142/82 | HR 95 | Temp 98.7°F | Resp 20 | Wt 261.0 lb

## 2016-03-05 DIAGNOSIS — E785 Hyperlipidemia, unspecified: Secondary | ICD-10-CM

## 2016-03-05 DIAGNOSIS — I10 Essential (primary) hypertension: Secondary | ICD-10-CM | POA: Diagnosis not present

## 2016-03-05 DIAGNOSIS — R7302 Impaired glucose tolerance (oral): Secondary | ICD-10-CM | POA: Diagnosis not present

## 2016-03-05 DIAGNOSIS — Z0001 Encounter for general adult medical examination with abnormal findings: Secondary | ICD-10-CM

## 2016-03-05 DIAGNOSIS — E039 Hypothyroidism, unspecified: Secondary | ICD-10-CM | POA: Insufficient documentation

## 2016-03-05 DIAGNOSIS — R6889 Other general symptoms and signs: Secondary | ICD-10-CM

## 2016-03-05 HISTORY — DX: Hypothyroidism, unspecified: E03.9

## 2016-03-05 MED ORDER — IRBESARTAN 150 MG PO TABS: 150.0000 mg | ORAL_TABLET | Freq: Every day | ORAL | Status: DC

## 2016-03-05 MED ORDER — AMLODIPINE BESYLATE 5 MG PO TABS: 5.0000 mg | ORAL_TABLET | Freq: Every day | ORAL | Status: DC

## 2016-03-05 MED ORDER — FLUTICASONE PROPIONATE 50 MCG/ACT NA SUSP: NASAL | Status: DC

## 2016-03-05 NOTE — Progress Notes (Signed)
Subjective:    Patient ID: Michelle Davis, female    DOB: 09/14/1976, 40 y.o.   MRN: [id number]  HPI  Here for wellness and f/u;  Overall doing ok;  Pt denies Chest pain, worsening SOB, DOE, wheezing, orthopnea, PND, worsening LE edema, palpitations, dizziness or syncope.  Pt denies neurological change such as new headache, facial or extremity weakness.  Pt denies polydipsia, polyuria, or low sugar symptoms. Pt states overall good compliance with treatment and medications, good tolerability, and has been trying to follow appropriate diet.  Pt denies worsening depressive symptoms, suicidal ideation or panic. No fever, night sweats, wt loss, loss of appetite, or other constitutional symptoms.  Pt states good ability with ADL's, has low fall risk, home safety reviewed and adequate, no other significant changes in hearing or vision, and only occasionally active with exercise.  Plans to do more activity with now more time since her daughter and grandchild have moved out.  Denies hyper or hypo thyroid symptoms such as voice, skin or hair change, but is hard to lose wt.   Past Medical History  Diagnosis Date  . Hypertension   . Hypothyroidism 03/05/2016   Past Surgical History  Procedure Laterality Date  . Tubal ligation  02/2000    reports that she has never smoked. She does not have any smokeless tobacco history on file. She reports that she does not drink alcohol or use illicit drugs. family history includes Hypertension in her other. No Known Allergies No current outpatient prescriptions on file prior to visit.   No current facility-administered medications on file prior to visit.   Review of Systems Constitutional: Negative for increased diaphoresis, or other activity, appetite or siginficant weight change other than noted HENT: Negative for worsening hearing loss, ear pain, facial swelling, mouth sores and neck stiffness.   Eyes: Negative for other worsening pain, redness or visual  disturbance.  Respiratory: Negative for choking or stridor Cardiovascular: Negative for other chest pain and palpitations.  Gastrointestinal: Negative for worsening diarrhea, blood in stool, or abdominal distention Genitourinary: Negative for hematuria, flank pain or change in urine volume.  Musculoskeletal: Negative for myalgias or other joint complaints.  Skin: Negative for other color change and wound or drainage.  Neurological: Negative for syncope and numbness. other than noted Hematological: Negative for adenopathy. or other swelling Psychiatric/Behavioral: Negative for hallucinations, SI, self-injury, decreased concentration or other worsening agitation.      Objective:   Physical Exam BP 142/82 mmHg  Pulse 95  Temp(Src) 98.7 F (37.1 C) (Oral)  Resp 20  Wt 261 lb (118.389 kg)  SpO2 99% VS noted,  Constitutional: Pt is oriented to person, place, and time. Appears well-developed and well-nourished, in no significant distress Head: Normocephalic and atraumatic  Eyes: Conjunctivae and EOM are normal. Pupils are equal, round, and reactive to light Right Ear: External ear normal.  Left Ear: External ear normal Nose: Nose normal.  Mouth/Throat: Oropharynx is clear and moist  Neck: Normal range of motion. Neck supple. No JVD present. No tracheal deviation present or significant neck LA or mass Cardiovascular: Normal rate, regular rhythm, normal heart sounds and intact distal pulses.   Pulmonary/Chest: Effort normal and breath sounds without rales or wheezing  Abdominal: Soft. Bowel sounds are normal. NT. No HSM  Musculoskeletal: Normal range of motion. Exhibits no edema Lymphadenopathy: Has no cervical adenopathy.  Neurological: Pt is alert and oriented to person, place, and time. Pt has normal reflexes. No cranial nerve deficit. Motor grossly intact Skin:  Skin is warm and dry. No rash noted or new ulcers Psychiatric:  Has normal mood and affect. Behavior is normal.       Assessment & Plan:

## 2016-03-05 NOTE — Assessment & Plan Note (Signed)
stable overall by history and exam, recent data reviewed with pt, and pt to continue medical treatment as before,  to f/u any worsening symptoms or concerns  BP Readings from Last 3 Encounters:  03/05/16 142/82  06/30/14 120/82  09/16/12 120/86

## 2016-03-05 NOTE — Assessment & Plan Note (Addendum)
Likely new onset, mild, for f/u tsh, likely to need start med but now asympt Lab Results  Component Value Date   TSH 5.02* 06/30/2014    In addition to the time spent performing CPE, I spent an additional 40 minutes face to face,in which greater than 50% of this time was spent in counseling and coordination of care for patient's acute illness as documented.\

## 2016-03-05 NOTE — Assessment & Plan Note (Signed)

## 2016-03-05 NOTE — Progress Notes (Signed)
Pre visit review using our clinic review tool, if applicable. No additional management support is needed unless otherwise documented below in the visit note. 

## 2016-03-05 NOTE — Assessment & Plan Note (Signed)
Severe uncontrolled, has been trying to work on diet,  Lab Results  Component Value Date   [place] 126* 11/09/2008   For f/u ldl

## 2016-03-05 NOTE — Patient Instructions (Addendum)
Please continue all other medications as before, and refills have been done if requested.  Please have the pharmacy call with any other refills you may need.  Please continue your efforts at being more active, low cholesterol diet, and weight control.  You are otherwise up to date with prevention measures today.  Please keep your appointments with your specialists as you may have planned  Please go to the LAB in the Basement (turn left off the elevator) for the tests to be done today  You will be contacted by phone if any changes need to be made immediately.  Otherwise, you will receive a letter about your results with an explanation, but please check with MyChart first.  Please remember to sign up for MyChart if you have not done so, as this will be important to you in the future with finding out test results, communicating by private email, and scheduling acute appointments online when needed.  Please return in 1 year for your yearly visit, or sooner if needed, with Lab testing done 3-5 days before Prevent

## 2016-09-17 ENCOUNTER — Telehealth: Payer: Self-pay | Admitting: Internal Medicine

## 2016-09-24 ENCOUNTER — Encounter: Payer: Self-pay | Admitting: Internal Medicine

## 2016-09-24 ENCOUNTER — Ambulatory Visit (INDEPENDENT_AMBULATORY_CARE_PROVIDER_SITE_OTHER): Payer: BLUE CROSS/BLUE SHIELD | Admitting: Internal Medicine

## 2016-09-24 VITALS — BP 144/80 | HR 100 | Resp 20 | Wt 263.5 lb

## 2016-09-24 DIAGNOSIS — M5442 Lumbago with sciatica, left side: Secondary | ICD-10-CM | POA: Diagnosis not present

## 2016-09-24 DIAGNOSIS — R7302 Impaired glucose tolerance (oral): Secondary | ICD-10-CM

## 2016-09-24 DIAGNOSIS — I10 Essential (primary) hypertension: Secondary | ICD-10-CM

## 2016-09-24 MED ORDER — PREDNISONE 10 MG PO TABS: ORAL_TABLET | ORAL | 0 refills | Status: DC

## 2016-09-24 MED ORDER — MELOXICAM 15 MG PO TABS: 15.0000 mg | ORAL_TABLET | Freq: Every day | ORAL | 0 refills | Status: DC | PRN

## 2016-09-24 MED ORDER — CYCLOBENZAPRINE HCL 5 MG PO TABS: 5.0000 mg | ORAL_TABLET | Freq: Three times a day (TID) | ORAL | 1 refills | Status: DC | PRN

## 2016-09-24 NOTE — Patient Instructions (Signed)
Please take all new medication as prescribed - the anti-inflammatory pain medication, prednisone, and muscle relaxer as needed  You will be contacted regarding the referral for: Dr Smith/sports med in this office (or you can make an appt as you leave at the scheduling desk)  Please continue all other medications as before, and refills have been done if requested.  Please have the pharmacy call with any other refills you may need.  Please keep your appointments with your specialists as you may have planned

## 2016-09-24 NOTE — Progress Notes (Signed)
   Subjective:    Patient ID: Michelle Davis, female    DOB: 11/28/1975, 40 y.o.   MRN: [id number]  HPI  Here with 3 wks daily lower back pain, after doing significant yard work with tree limbs and leaves, lower back, radiates around the left towards the left groin, moderate with severe at times, without bowel or bladder change, fever, wt loss,  worsening LE pain/numbness/weakness, significant gait change or falls.  Worse to sit down, Denies urinary symptoms such as dysuria, frequency, urgency, flank pain, hematuria or n/v, fever, chills.Denies worsening reflux, abd pain, dysphagia, n/v, bowel change or blood.  Also BP sometimes 140-150's recently. Good compliance with meds.  Pt denies polydipsia, polyuria,  Past Medical History:  Diagnosis Date  . Hypertension   . Hypothyroidism 03/05/2016   Past Surgical History:  Procedure Laterality Date  . TUBAL LIGATION  02/2000    reports that she has never smoked. She does not have any smokeless tobacco history on file. She reports that she does not drink alcohol or use drugs. family history includes Hypertension in her other. No Known Allergies Current Outpatient Prescriptions on File Prior to Visit  Medication Sig Dispense Refill  . amLODipine (NORVASC) 5 MG tablet Take 1 tablet (5 mg total) by mouth daily. 90 tablet 3  . fluticasone (FLONASE) 50 MCG/ACT nasal spray PLACE 2 SPRAYS INTO BOTH NOSTRILS DAILY. 16 g 4  . irbesartan (AVAPRO) 150 MG tablet Take 1 tablet (150 mg total) by mouth daily. 90 tablet 3   No current facility-administered medications on file prior to visit.    Review of Systems  Constitutional: Negative for unusual diaphoresis or night sweats HENT: Negative for ear swelling or discharge Eyes: Negative for worsening visual haziness  Respiratory: Negative for choking and stridor.   Gastrointestinal: Negative for distension or worsening eructation Genitourinary: Negative for retention or change in urine volume.    Musculoskeletal: Negative for other MSK pain or swelling Skin: Negative for color change and worsening wound Neurological: Negative for tremors and numbness other than noted  Psychiatric/Behavioral: Negative for decreased concentration or agitation other than above   All other system neg per pt    Objective:   Physical Exam BP (!) 144/80   Pulse 100   Resp 20   Wt 263 lb 8 oz (119.5 kg)   SpO2 97%   BMI 45.23 kg/m   VS noted,  Constitutional: Pt appears in no apparent distress HENT: Head: NCAT.  Right Ear: External ear normal.  Left Ear: External ear normal.  Eyes: . Pupils are equal, round, and reactive to light. Conjunctivae and EOM are normal Neck: Normal range of motion. Neck supple.  Cardiovascular: Normal rate and regular rhythm.   Pulmonary/Chest: Effort normal and breath sounds without rales or wheezing.  Abd:  Soft, NT, ND, + BS, no flank tender Neurological: Pt is alert. Not confused , motor 5/5 intact, sens/dtr intact Skin: Skin is warm. No rash, no LE edema Psychiatric: Pt behavior is normal. No agitation.  Spine with tender lowest lumbar midline and left lumbar paravertebral without swelling or rash    Assessment & Plan:

## 2016-09-24 NOTE — Progress Notes (Signed)
Pre visit review using our clinic review tool, if applicable. No additional management support is needed unless otherwise documented below in the visit note. 

## 2016-09-25 ENCOUNTER — Ambulatory Visit: Payer: BLUE CROSS/BLUE SHIELD | Admitting: Internal Medicine

## 2016-09-30 NOTE — Assessment & Plan Note (Signed)
stable overall by history and exam, recent data reviewed with pt, and pt to continue medical treatment as before,  to f/u any worsening symptoms or concerns Lab Results  Component Value Date   HGBA1C 6.4 06/30/2014    

## 2016-09-30 NOTE — Assessment & Plan Note (Addendum)
Mild elevated likely reactive, o/w stable overall by history and exam, recent data reviewed with pt, and pt to continue medical treatment as before,  to f/u any worsening symptoms or concerns BP Readings from Last 3 Encounters:  09/24/16 (!) 144/80  03/05/16 (!) 142/82  06/30/14 120/82

## 2016-09-30 NOTE — Assessment & Plan Note (Signed)
Etiology unclear but suspect related to msk strain vs underlying djd or ddd, for mobic prn, predpac asd, flexeril tid prn, and refer Dr Smith/sport med if not improved

## 2016-10-07 NOTE — Progress Notes (Signed)
[name] [address] [place], [place] [address] Phone: [phone] Subjective:    I'm seeing this patient by the request  of:  Cathlean Cower, MD   CC: Low back pain  [id number]  Michelle Davis is a 40 y.o. female coming in with complaint of low back pain. Been going on for approximately 6 weeks. Seem to get worse when she was doing more yard work. States that the pain is on the lower back but can radiate to the left groin area. Can be severe at time with severity of 8 out of 10. Denies any bowel or bladder changes. Denies any fevers chills or any abnormal weight loss. Patient denies any weakness. Denies any lower extremity numbness. Patient states He had pain mostly on the left side. Seems to stay localized. Patient states no longer having any radiation to the groin when she was having previously. Patient denies any fevers chills or any abnormal weight loss. Patient states that laying down on her back seems to make it worse. Repetitive activity as well seems to make it somewhat uncomfortable.     Past Medical History:  Diagnosis Date  . Hypertension   . Hypothyroidism 03/05/2016   Past Surgical History:  Procedure Laterality Date  . TUBAL LIGATION  02/2000   Social History   Social History  . Marital status: Married    Spouse name: N/A  . Number of children: N/A  . Years of education: N/A   Social History Main Topics  . Smoking status: Never Smoker  . Smokeless tobacco: None  . Alcohol use No  . Drug use: No  . Sexual activity: Not Asked   Other Topics Concern  . None   Social History Narrative   Regular exercise-no   No Known Allergies Family History  Problem Relation Age of Onset  . Hypertension Other     Grandmother-and pituitary tumor    Past medical history, social, surgical and family history all reviewed in electronic medical record.  No pertanent information unless stated regarding to the chief complaint.   Review of  Systems:Review of systems updated and as accurate as of 10/08/16  No headache, visual changes, nausea, vomiting, diarrhea, constipation, dizziness, abdominal pain, skin rash, fevers, chills, night sweats, weight loss, swollen lymph nodes, body aches, joint swelling, muscle aches, chest pain, shortness of breath, mood changes.   Objective  Blood pressure 132/84, pulse (!) 107, height 5\' 3"  (1.6 m), weight 267 lb (121.1 kg), SpO2 97 %. Systems examined below as of 10/08/16   General: No apparent distress alert and oriented x3 mood and affect normal, dressed appropriately.  HEENT: Pupils equal, extraocular movements intact  Respiratory: Patient's speak in full sentences and does not appear short of breath  Cardiovascular: No lower extremity edema, non tender, no erythema  Skin: Warm dry intact with no signs of infection or rash on extremities or on axial skeleton.  Abdomen: Soft nontender obese Neuro: Cranial nerves II through XII are intact, neurovascularly intact in all extremities with 2+ DTRs and 2+ pulses.  Lymph: No lymphadenopathy of posterior or anterior cervical chain or axillae bilaterally.  Gait normal with good balance and coordination.  MSK:  Non tender with full range of motion and good stability and symmetric strength and tone of shoulders, elbows, wrist, hip, knee and ankles bilaterally.  Back Exam:  Inspection: Unremarkable  Motion: Flexion 40 deg, Extension 25 deg, Side Bending to 40 deg bilaterally,  Rotation to 40 deg bilaterally  SLR laying: Negative  XSLR laying: Negative  Palpable tenderness: TTP mostly over the left sacroiliac joint. FABER: Positive left. Sensory change: Gross sensation intact to all lumbar and sacral dermatomes.  Reflexes: 2+ at both patellar tendons, 2+ at achilles tendons, Babinski's downgoing.  Strength at foot  Plantar-flexion: 5/5 Dorsi-flexion: 5/5 Eversion: 5/5 Inversion: 5/5  Leg strength  Quad: 5/5 Hamstring: 5/5 Hip flexor: 5/5 Hip  abductors: 4/5 but symmetric Gait unremarkable.  Osteopathic findings T3 extended rotated and side bent right L2 flexed rotated and side bent right Sacrum left on left    Procedure note 97110; 15 minutes spent for Therapeutic exercises as stated in above notes.  This included exercises focusing on stretching, strengthening, with significant focus on eccentric aspects.  Sacroiliac Joint Mobilization and Rehab 1. Work on pretzel stretching, shoulder back and leg draped in front. 3-5 sets, 30 sec.. 2. hip abductor rotations. standing, hip flexion and rotation outward then inward. 3 sets, 15 reps. when can do comfortably, add ankle weights starting at 2 pounds.  3. cross over stretching - shoulder back to ground, same side leg crossover. 3-5 sets for 30 min..  4. rolling up and back knees to chest and rocking. 5. sacral tilt - 5 sets, hold for 5-10 seconds Proper technique shown and discussed handout in great detail with ATC.  All questions were discussed and answered.   Impression and Recommendations:     This case required medical decision making of moderate complexity.      Note: This dictation was prepared with Dragon dictation along with smaller phrase technology. Any transcriptional errors that result from this process are unintentional.

## 2016-10-08 ENCOUNTER — Encounter: Payer: Self-pay | Admitting: Family Medicine

## 2016-10-08 ENCOUNTER — Ambulatory Visit (INDEPENDENT_AMBULATORY_CARE_PROVIDER_SITE_OTHER): Payer: BLUE CROSS/BLUE SHIELD | Admitting: Family Medicine

## 2016-10-08 DIAGNOSIS — M999 Biomechanical lesion, unspecified: Secondary | ICD-10-CM | POA: Insufficient documentation

## 2016-10-08 DIAGNOSIS — M533 Sacrococcygeal disorders, not elsewhere classified: Secondary | ICD-10-CM | POA: Diagnosis not present

## 2016-10-08 MED ORDER — DICLOFENAC SODIUM 2 % TD SOLN: TRANSDERMAL | 3 refills | Status: DC

## 2016-10-08 NOTE — Assessment & Plan Note (Signed)
Sacroiliac Joint Mobilization and Rehab 1. Work on pretzel stretching, shoulder back and leg draped in front. 3-5 sets, 30 sec.. 2. hip abductor rotations. standing, hip flexion and rotation outward then inward. 3 sets, 15 reps. when can do comfortably, add ankle weights starting at 2 pounds.  3. cross over stretching - shoulder back to ground, same side leg crossover. 3-5 sets for 30 min..  4. rolling up and back knees to chest and rocking. 5. sacral tilt - 5 sets, hold for 5-10 seconds

## 2016-10-08 NOTE — Patient Instructions (Signed)
Good to see you  Ice 20 minutes 2 times daily. Usually after activity and before bed. Exercises 3 times a week.  pennsaid pinkie amount topically 2 times daily as needed.  I think you will feel great with the weight loss.  Good shoes with rigid bottom.  Jalene Mullet, Merrell or New balance greater then 700 When doing repetitive flexed activity make sure you strecth  See me again in 4 weeks Happy holidays!

## 2016-10-08 NOTE — Assessment & Plan Note (Signed)
Decision today to treat with OMT was based on Physical Exam  After verbal consent patient was treated with HVLA, ME techniques in thoracic, lumbar and sacral areas  Patient tolerated the procedure well with improvement in symptoms  Patient given exercises, stretches and lifestyle modifications  See medications in patient instructions if given  Patient will follow up in 3-4 weeks  

## 2016-11-06 ENCOUNTER — Ambulatory Visit: Payer: BLUE CROSS/BLUE SHIELD | Admitting: Family Medicine

## 2017-03-06 ENCOUNTER — Encounter: Payer: BLUE CROSS/BLUE SHIELD | Admitting: Internal Medicine

## 2017-04-14 ENCOUNTER — Other Ambulatory Visit: Payer: Self-pay | Admitting: Internal Medicine

## 2017-04-29 NOTE — Telephone Encounter (Signed)
error 

## 2017-05-18 ENCOUNTER — Other Ambulatory Visit: Payer: Self-pay | Admitting: Internal Medicine

## 2017-06-02 HISTORY — PX: ABDOMINAL HYSTERECTOMY: SHX81

## 2017-06-24 ENCOUNTER — Other Ambulatory Visit (INDEPENDENT_AMBULATORY_CARE_PROVIDER_SITE_OTHER): Payer: BLUE CROSS/BLUE SHIELD

## 2017-06-24 ENCOUNTER — Encounter: Payer: Self-pay | Admitting: Internal Medicine

## 2017-06-24 ENCOUNTER — Ambulatory Visit (INDEPENDENT_AMBULATORY_CARE_PROVIDER_SITE_OTHER): Payer: BLUE CROSS/BLUE SHIELD | Admitting: Internal Medicine

## 2017-06-24 ENCOUNTER — Other Ambulatory Visit: Payer: Self-pay | Admitting: Internal Medicine

## 2017-06-24 VITALS — BP 128/88 | HR 92 | Temp 98.8°F | Ht 63.0 in | Wt 260.0 lb

## 2017-06-24 DIAGNOSIS — E039 Hypothyroidism, unspecified: Secondary | ICD-10-CM

## 2017-06-24 DIAGNOSIS — R7302 Impaired glucose tolerance (oral): Secondary | ICD-10-CM

## 2017-06-24 DIAGNOSIS — E785 Hyperlipidemia, unspecified: Secondary | ICD-10-CM | POA: Diagnosis not present

## 2017-06-24 DIAGNOSIS — I10 Essential (primary) hypertension: Secondary | ICD-10-CM | POA: Diagnosis not present

## 2017-06-24 DIAGNOSIS — Z0000 Encounter for general adult medical examination without abnormal findings: Secondary | ICD-10-CM

## 2017-06-24 LAB — BASIC METABOLIC PANEL
BUN: 12 mg/dL (ref 6–23)
CHLORIDE: 103 meq/L (ref 96–112)
CO2: 29 meq/L (ref 19–32)
Calcium: 9.7 mg/dL (ref 8.4–10.5)
Creatinine, Ser: 0.73 mg/dL (ref 0.40–1.20)
GFR: 92.32 mL/min (ref >60.00)
Glucose, Bld: 131 mg/dL — ABNORMAL HIGH (ref 70–99)
Potassium: 3.5 mEq/L (ref 3.5–5.1)
Sodium: 139 mEq/L (ref 135–145)

## 2017-06-24 LAB — CBC WITH DIFFERENTIAL/PLATELET
Basophils Absolute: 0 K/uL (ref 0.0–0.1)
Basophils Relative: 0.4 % (ref 0.0–3.0)
Eosinophils Absolute: 0.3 K/uL (ref 0.0–0.7)
Eosinophils Relative: 4 % (ref 0.0–5.0)
HCT: 39.6 % (ref 36.0–46.0)
Hemoglobin: 13.1 g/dL (ref 12.0–15.0)
Lymphocytes Relative: 42.5 % (ref 12.0–46.0)
Lymphs Abs: 3.6 K/uL (ref 0.7–4.0)
MCHC: 33 g/dL (ref 30.0–36.0)
MCV: 90.6 fl (ref 78.0–100.0)
Monocytes Absolute: 0.7 K/uL (ref 0.1–1.0)
Monocytes Relative: 8.3 % (ref 3.0–12.0)
Neutro Abs: 3.8 K/uL (ref 1.4–7.7)
Neutrophils Relative %: 44.8 % (ref 43.0–77.0)
Platelets: 262 K/uL (ref 150.0–400.0)
RBC: 4.37 Mil/uL (ref 3.87–5.11)
RDW: 12.9 % (ref 11.5–15.5)
WBC: 8.5 K/uL (ref 4.0–10.5)

## 2017-06-24 LAB — LIPID PANEL
Cholesterol: 227 mg/dL — ABNORMAL HIGH (ref 0–200)
HDL: 42.6 mg/dL
LDL Cholesterol: 152 mg/dL — ABNORMAL HIGH (ref 0–99)
NonHDL: 184.2
Total CHOL/HDL Ratio: 5
Triglycerides: 160 mg/dL — ABNORMAL HIGH (ref 0.0–149.0)
VLDL: 32 mg/dL (ref 0.0–40.0)

## 2017-06-24 LAB — TSH: TSH: 3.88 u[IU]/mL (ref 0.35–4.50)

## 2017-06-24 LAB — HEPATIC FUNCTION PANEL
ALT: 24 U/L (ref 0–35)
AST: 20 U/L (ref 0–37)
Albumin: 4.6 g/dL (ref 3.5–5.2)
Alkaline Phosphatase: 65 U/L (ref 39–117)
Bilirubin, Direct: 0.1 mg/dL (ref 0.0–0.3)
Total Bilirubin: 0.3 mg/dL (ref 0.2–1.2)
Total Protein: 7.7 g/dL (ref 6.0–8.3)

## 2017-06-24 LAB — URINALYSIS, ROUTINE W REFLEX MICROSCOPIC
Bilirubin Urine: NEGATIVE
Ketones, ur: NEGATIVE
Nitrite: NEGATIVE
Specific Gravity, Urine: 1.015
Total Protein, Urine: NEGATIVE
Urine Glucose: NEGATIVE
Urobilinogen, UA: 0.2
pH: 6 (ref 5.0–8.0)

## 2017-06-24 LAB — HEMOGLOBIN A1C: HEMOGLOBIN A1C: 6.4 % (ref 4.6–6.5)

## 2017-06-24 LAB — T4, FREE: Free T4: 0.63 ng/dL (ref 0.60–1.60)

## 2017-06-24 MED ORDER — ROSUVASTATIN CALCIUM 10 MG PO TABS: 10.0000 mg | ORAL_TABLET | Freq: Every day | ORAL | 3 refills | Status: DC

## 2017-06-24 MED ORDER — ASPIRIN 81 MG PO TBEC: 81.0000 mg | DELAYED_RELEASE_TABLET | Freq: Every day | ORAL | 12 refills | Status: DC

## 2017-06-24 MED ORDER — CEPHALEXIN 500 MG PO CAPS: 500.0000 mg | ORAL_CAPSULE | Freq: Three times a day (TID) | ORAL | 0 refills | Status: AC

## 2017-06-24 MED ORDER — AMLODIPINE BESYLATE 5 MG PO TABS: 5.0000 mg | ORAL_TABLET | Freq: Every day | ORAL | 3 refills | Status: DC

## 2017-06-24 MED ORDER — IRBESARTAN 150 MG PO TABS: ORAL_TABLET | ORAL | 3 refills | Status: DC

## 2017-06-24 NOTE — Patient Instructions (Addendum)
Please start Aspirin 81 mg -1 per day  Please continue all other medications as before, and refills have been done if requested.  Please have the pharmacy call with any other refills you may need.  Please continue your efforts at being more active, low cholesterol diet, and weight control.  You are otherwise up to date with prevention measures today.  Please keep your appointments with your specialists as you may have planned  Please go to the LAB in the Basement (turn left off the elevator) for the tests to be done today  You will be contacted by phone if any changes need to be made immediately.  Otherwise, you will receive a letter about your results with an explanation, but please check with MyChart first.  Please remember to sign up for MyChart if you have not done so, as this will be important to you in the future with finding out test results, communicating by private email, and scheduling acute appointments online when needed.  Please return in 1 year for your yearly visit, or sooner if needed, with Lab testing done 3-5 days before

## 2017-06-24 NOTE — Progress Notes (Signed)
Subjective:    Patient ID: Michelle Davis, female    DOB: 09-30-1976, 41 y.o.   MRN: [id number]  HPI  Here for wellness and f/u;  Overall doing ok;  Pt denies Chest pain, worsening SOB, DOE, wheezing, orthopnea, PND, worsening LE edema, palpitations, dizziness or syncope.  Pt denies neurological change such as new headache, facial or extremity weakness.  Pt denies polydipsia, polyuria, or low sugar symptoms. Pt states overall good compliance with treatment and medications, good tolerability, and has been trying to follow appropriate diet.  Pt denies worsening depressive symptoms, suicidal ideation or panic. No fever, night sweats, wt loss, loss of appetite, or other constitutional symptoms.  Pt states good ability with ADL's, has low fall risk, home safety reviewed and adequate, no other significant changes in hearing or vision, and only occasionally active with exercise. BP Readings from Last 3 Encounters:  06/24/17 128/88  10/08/16 132/84  09/24/16 (!) 144/80   Wt Readings from Last 3 Encounters:  06/24/17 260 lb (117.9 kg)  10/08/16 267 lb (121.1 kg)  09/24/16 263 lb 8 oz (119.5 kg)  Just s/p TAH for uterine polpy and bleeding.  Denies hyper or hypo thyroid symptoms such as voice, skin or hair change.  Past Medical History:  Diagnosis Date  . Hypertension   . Hypothyroidism 03/05/2016   Past Surgical History:  Procedure Laterality Date  . ABDOMINAL HYSTERECTOMY  06/02/2017   (total)  . TUBAL LIGATION  02/2000    reports that she has never smoked. She has never used smokeless tobacco. She reports that she does not drink alcohol or use drugs. family history includes Hypertension in her other. No Known Allergies No current outpatient prescriptions on file prior to visit.   No current facility-administered medications on file prior to visit.    Review of Systems Constitutional: Negative for other unusual diaphoresis, sweats, appetite or weight changes HENT: Negative for other  worsening hearing loss, ear pain, facial swelling, mouth sores or neck stiffness.   Eyes: Negative for other worsening pain, redness or other visual disturbance.  Respiratory: Negative for other stridor or swelling Cardiovascular: Negative for other palpitations or other chest pain  Gastrointestinal: Negative for worsening diarrhea or loose stools, blood in stool, distention or other pain Genitourinary: Negative for hematuria, flank pain or other change in urine volume.  Musculoskeletal: Negative for myalgias or other joint swelling.  Skin: Negative for other color change, or other wound or worsening drainage.  Neurological: Negative for other syncope or numbness. Hematological: Negative for other adenopathy or swelling Psychiatric/Behavioral: Negative for hallucinations, other worsening agitation, SI, self-injury, or new decreased concentration All other system neg per pt    Objective:   Physical Exam BP 128/88   Pulse 92   Temp 98.8 F (37.1 C) (Oral)   Ht 5\' 3"  (1.6 m)   Wt 260 lb (117.9 kg)   SpO2 99%   BMI 46.06 kg/m  VS noted,  Constitutional: Pt is oriented to person, place, and time. Appears well-developed and well-nourished, in no significant distress and comfortable Head: Normocephalic and atraumatic  Eyes: Conjunctivae and EOM are normal. Pupils are equal, round, and reactive to light Right Ear: External ear normal without discharge Left Ear: External ear normal without discharge Nose: Nose without discharge or deformity Mouth/Throat: Oropharynx is without other ulcerations and moist  Neck: Normal range of motion. Neck supple. No JVD present. No tracheal deviation present or significant neck LA or mass Cardiovascular: Normal rate, regular rhythm, normal heart sounds  and intact distal pulses.   Pulmonary/Chest: WOB normal and breath sounds without rales or wheezing  Abdominal: Soft. Bowel sounds are normal. NT. No HSM  Musculoskeletal: Normal range of motion. Exhibits no  edema Lymphadenopathy: Has no other cervical adenopathy.  Neurological: Pt is alert and oriented to person, place, and time. Pt has normal reflexes. No cranial nerve deficit. Motor grossly intact, Gait intact Skin: Skin is warm and dry. No rash noted or new ulcerations Psychiatric:  Has normal mood and affect. Behavior is normal without agitation No other exam findings     Assessment & Plan:

## 2017-06-25 ENCOUNTER — Telehealth: Payer: Self-pay

## 2017-06-25 NOTE — Telephone Encounter (Signed)
-----   Message from Biagio Borg, MD sent at 06/24/2017  9:01 PM EDT ----- Left message on MyChart, pt to cont same tx except  The test results show that your current treatment is OK, except the LDL cholesterol is quite high, and the urine test is consistent with infection.  I would like to start a statin called crestor 10 mg per day, as well as an antibiotic, to be sent to your pharmacy.  You should hear from the office about this as well.  Shirron to please inform pt, I will do rx x 2

## 2017-06-25 NOTE — Telephone Encounter (Signed)
Called pt, LVM.   

## 2017-06-26 NOTE — Assessment & Plan Note (Signed)

## 2017-06-26 NOTE — Assessment & Plan Note (Signed)
stable overall by history and exam, and pt to continue medical treatment as before,  to f/u any worsening symptoms or concerns, consider statin, for low chol diet

## 2017-06-26 NOTE — Assessment & Plan Note (Signed)
stable overall by history and exam, recent data reviewed with pt, and pt to continue medical treatment as before,  to f/u any worsening symptoms or concerns Lab Results  Component Value Date   HGBA1C 6.4 06/24/2017

## 2017-06-26 NOTE — Assessment & Plan Note (Signed)
stable overall by history and exam, recent data reviewed with pt, and pt to continue medical treatment as before,  to f/u any worsening symptoms or concerns BP Readings from Last 3 Encounters:  06/24/17 128/88  10/08/16 132/84  09/24/16 (!) 144/80

## 2017-06-26 NOTE — Assessment & Plan Note (Signed)
stable overall by history and exam, recent data reviewed with pt, and pt to continue medical treatment as before,  to f/u any worsening symptoms or concerns Lab Results  Component Value Date   TSH 3.88 06/24/2017

## 2017-07-07 ENCOUNTER — Other Ambulatory Visit: Payer: Self-pay | Admitting: Internal Medicine

## 2018-06-26 ENCOUNTER — Encounter: Payer: BLUE CROSS/BLUE SHIELD | Admitting: Internal Medicine

## 2018-07-14 ENCOUNTER — Ambulatory Visit (INDEPENDENT_AMBULATORY_CARE_PROVIDER_SITE_OTHER): Payer: Managed Care, Other (non HMO) | Admitting: Internal Medicine

## 2018-07-14 ENCOUNTER — Encounter: Payer: Self-pay | Admitting: Internal Medicine

## 2018-07-14 VITALS — BP 124/86 | HR 95 | Temp 98.6°F | Ht 63.0 in | Wt 264.0 lb

## 2018-07-14 DIAGNOSIS — Z0000 Encounter for general adult medical examination without abnormal findings: Secondary | ICD-10-CM

## 2018-07-14 DIAGNOSIS — Z114 Encounter for screening for human immunodeficiency virus [HIV]: Secondary | ICD-10-CM

## 2018-07-14 DIAGNOSIS — R7302 Impaired glucose tolerance (oral): Secondary | ICD-10-CM

## 2018-07-14 MED ORDER — ROSUVASTATIN CALCIUM 10 MG PO TABS: 10.0000 mg | ORAL_TABLET | Freq: Every day | ORAL | 3 refills | Status: DC

## 2018-07-14 MED ORDER — IRBESARTAN 150 MG PO TABS: 150.0000 mg | ORAL_TABLET | Freq: Every day | ORAL | 3 refills | Status: DC

## 2018-07-14 MED ORDER — AMLODIPINE BESYLATE 5 MG PO TABS: 5.0000 mg | ORAL_TABLET | Freq: Every day | ORAL | 3 refills | Status: DC

## 2018-07-14 NOTE — Patient Instructions (Signed)
Please continue all other medications as before, and refills have been done if requested.  Please have the pharmacy call with any other refills you may need.  Please continue your efforts at being more active, low cholesterol diet, and weight control.  You are otherwise up to date with prevention measures today.  Please keep your appointments with your specialists as you may have planned  Please go to the LAB in the Basement (turn left off the elevator) for the tests to be done tomorrow as you prefer  You will be contacted by phone if any changes need to be made immediately.  Otherwise, you will receive a letter about your results with an explanation, but please check with MyChart first.  Please remember to sign up for MyChart if you have not done so, as this will be important to you in the future with finding out test results, communicating by private email, and scheduling acute appointments online when needed.  Please return in 1 year for your yearly visit, or sooner if needed, with Lab testing done 3-5 days before

## 2018-07-14 NOTE — Progress Notes (Signed)
Subjective:    Patient ID: Michelle Davis, female    DOB: 15-Sep-1976, 42 y.o.   MRN: [id number]  HPI  Here for wellness and f/u;  Overall doing ok;  Pt denies Chest pain, worsening SOB, DOE, wheezing, orthopnea, PND, worsening LE edema, palpitations, dizziness or syncope.  Pt denies neurological change such as new headache, facial or extremity weakness.  Pt denies polydipsia, polyuria, or low sugar symptoms. Pt states overall good compliance with treatment and medications, good tolerability, and has been trying to follow appropriate diet.  Pt denies worsening depressive symptoms, suicidal ideation or panic. No fever, night sweats, wt loss, loss of appetite, or other constitutional symptoms.  Pt states good ability with ADL's, has low fall risk, home safety reviewed and adequate, no other significant changes in hearing or vision, and only occasionally active with exercise.  Taking crestor every other day due to leg cramps better with not taking.  No new complaints Past Medical History:  Diagnosis Date  . Hypertension   . Hypothyroidism 03/05/2016   Past Surgical History:  Procedure Laterality Date  . ABDOMINAL HYSTERECTOMY  06/02/2017   (total)  . TUBAL LIGATION  02/2000    reports that she has never smoked. She has never used smokeless tobacco. She reports that she does not drink alcohol or use drugs. family history includes Hypertension in her other. No Known Allergies Current Outpatient Medications on File Prior to Visit  Medication Sig Dispense Refill  . aspirin 81 MG EC tablet Take 1 tablet (81 mg total) by mouth daily. Swallow whole. 30 tablet 12  . estrogens, conjugated, (PREMARIN) 0.625 MG tablet Take 0.625 mg by mouth daily. Take daily for 21 days then do not take for 7 days.     No current facility-administered medications on file prior to visit.    Review of Systems  Constitutional: Negative for other unusual diaphoresis or sweats HENT: Negative for ear discharge or  swelling Eyes: Negative for other worsening visual disturbances Respiratory: Negative for stridor or other swelling  Gastrointestinal: Negative for worsening distension or other blood Genitourinary: Negative for retention or other urinary change Musculoskeletal: Negative for other MSK pain or swelling Skin: Negative for color change or other new lesions Neurological: Negative for worsening tremors and other numbness  Psychiatric/Behavioral: Negative for worsening agitation or other fatigue All other system neg per pt    Objective:   Physical Exam BP 124/86   Pulse 95   Temp 98.6 F (37 C) (Oral)   Ht 5\' 3"  (1.6 m)   Wt 264 lb (119.7 kg)   SpO2 97%   BMI 46.77 kg/m  VS noted,  Constitutional: Pt appears in NAD HENT: Head: NCAT.  Right Ear: External ear normal.  Left Ear: External ear normal.  Eyes: . Pupils are equal, round, and reactive to light. Conjunctivae and EOM are normal Nose: without d/c or deformity Neck: Neck supple. Gross normal ROM Cardiovascular: Normal rate and regular rhythm.   Pulmonary/Chest: Effort normal and breath sounds without rales or wheezing.  Abd:  Soft, NT, ND, + BS, no organomegaly Neurological: Pt is alert. At baseline orientation, motor grossly intact Skin: Skin is warm. No rashes, other new lesions, no LE edema Psychiatric: Pt behavior is normal without agitation  No other exam findings Lab Results  Component Value Date   WBC 8.5 06/24/2017   HGB 13.1 06/24/2017   HCT 39.6 06/24/2017   PLT 262.0 06/24/2017   GLUCOSE 131 (H) 06/24/2017   CHOL 227 (H)  06/24/2017   TRIG 160.0 (H) 06/24/2017   HDL 42.60 06/24/2017   LDLDIRECT 172.9 06/30/2014   LDLCALC 152 (H) 06/24/2017   ALT 24 06/24/2017   AST 20 06/24/2017   NA 139 06/24/2017   K 3.5 06/24/2017   CL 103 06/24/2017   CREATININE 0.73 06/24/2017   BUN 12 06/24/2017   CO2 29 06/24/2017   TSH 3.88 06/24/2017   HGBA1C 6.4 06/24/2017       Assessment & Plan:

## 2018-07-14 NOTE — Assessment & Plan Note (Signed)
stable overall by history and exam, recent data reviewed with pt, and pt to continue medical treatment as before,  to f/u any worsening symptoms or concerns  

## 2018-07-14 NOTE — Assessment & Plan Note (Signed)

## 2018-07-28 ENCOUNTER — Other Ambulatory Visit: Payer: Self-pay | Admitting: Internal Medicine

## 2018-07-28 ENCOUNTER — Other Ambulatory Visit (INDEPENDENT_AMBULATORY_CARE_PROVIDER_SITE_OTHER): Payer: Managed Care, Other (non HMO)

## 2018-07-28 ENCOUNTER — Encounter: Payer: Self-pay | Admitting: Internal Medicine

## 2018-07-28 DIAGNOSIS — Z0000 Encounter for general adult medical examination without abnormal findings: Secondary | ICD-10-CM

## 2018-07-28 DIAGNOSIS — Z114 Encounter for screening for human immunodeficiency virus [HIV]: Secondary | ICD-10-CM

## 2018-07-28 DIAGNOSIS — R7302 Impaired glucose tolerance (oral): Secondary | ICD-10-CM

## 2018-07-28 LAB — CBC WITH DIFFERENTIAL/PLATELET
BASOS ABS: 0 10*3/uL (ref 0.0–0.1)
Basophils Relative: 0.6 % (ref 0.0–3.0)
EOS ABS: 0.4 10*3/uL (ref 0.0–0.7)
Eosinophils Relative: 4.7 % (ref 0.0–5.0)
HCT: 42.4 % (ref 36.0–46.0)
Hemoglobin: 14.6 g/dL (ref 12.0–15.0)
Lymphocytes Relative: 40.5 % (ref 12.0–46.0)
Lymphs Abs: 3 10*3/uL (ref 0.7–4.0)
MCHC: 34.4 g/dL (ref 30.0–36.0)
MCV: 89.5 fl (ref 78.0–100.0)
MONO ABS: 0.7 10*3/uL (ref 0.1–1.0)
Monocytes Relative: 9.2 % (ref 3.0–12.0)
NEUTROS PCT: 45 % (ref 43.0–77.0)
Neutro Abs: 3.4 10*3/uL (ref 1.4–7.7)
PLATELETS: 204 10*3/uL (ref 150.0–400.0)
RBC: 4.74 Mil/uL (ref 3.87–5.11)
RDW: 12.4 % (ref 11.5–15.5)
WBC: 7.5 10*3/uL (ref 4.0–10.5)

## 2018-07-28 LAB — LIPID PANEL
CHOLESTEROL: 176 mg/dL (ref 0–200)
HDL: 52 mg/dL (ref >39.00)
LDL CALC: 93 mg/dL (ref 0–99)
NonHDL: 123.59
TRIGLYCERIDES: 155 mg/dL — AB (ref 0.0–149.0)
Total CHOL/HDL Ratio: 3
VLDL: 31 mg/dL (ref 0.0–40.0)

## 2018-07-28 LAB — URINALYSIS, ROUTINE W REFLEX MICROSCOPIC
BILIRUBIN URINE: NEGATIVE
Hgb urine dipstick: NEGATIVE
KETONES UR: NEGATIVE
Leukocytes, UA: NEGATIVE
NITRITE: NEGATIVE
PH: 5.5 (ref 5.0–8.0)
Specific Gravity, Urine: 1.025 (ref 1.000–1.030)
TOTAL PROTEIN, URINE-UPE24: NEGATIVE
Urine Glucose: NEGATIVE
Urobilinogen, UA: 0.2 (ref 0.0–1.0)

## 2018-07-28 LAB — HEMOGLOBIN A1C: Hgb A1c MFr Bld: 6.6 % — ABNORMAL HIGH (ref 4.6–6.5)

## 2018-07-28 LAB — HEPATIC FUNCTION PANEL
ALBUMIN: 4.2 g/dL (ref 3.5–5.2)
ALK PHOS: 69 U/L (ref 39–117)
ALT: 29 U/L (ref 0–35)
AST: 34 U/L (ref 0–37)
BILIRUBIN DIRECT: 0.1 mg/dL (ref 0.0–0.3)
TOTAL PROTEIN: 7.4 g/dL (ref 6.0–8.3)
Total Bilirubin: 0.4 mg/dL (ref 0.2–1.2)

## 2018-07-28 LAB — BASIC METABOLIC PANEL
BUN: 14 mg/dL (ref 6–23)
CALCIUM: 9.7 mg/dL (ref 8.4–10.5)
CO2: 28 mEq/L (ref 19–32)
CREATININE: 0.73 mg/dL (ref 0.40–1.20)
Chloride: 102 mEq/L (ref 96–112)
GFR: 91.86 mL/min (ref >60.00)
Glucose, Bld: 113 mg/dL — ABNORMAL HIGH (ref 70–99)
Potassium: 4.1 mEq/L (ref 3.5–5.1)
Sodium: 139 mEq/L (ref 135–145)

## 2018-07-28 LAB — TSH: TSH: 13 u[IU]/mL — AB (ref 0.35–4.50)

## 2018-07-28 MED ORDER — LEVOTHYROXINE SODIUM 50 MCG PO TABS: 50.0000 ug | ORAL_TABLET | Freq: Every day | ORAL | 3 refills | Status: DC

## 2018-07-29 ENCOUNTER — Telehealth: Payer: Self-pay

## 2018-07-29 DIAGNOSIS — E039 Hypothyroidism, unspecified: Secondary | ICD-10-CM

## 2018-07-29 LAB — HIV ANTIBODY (ROUTINE TESTING W REFLEX): HIV: NONREACTIVE

## 2018-07-29 NOTE — Telephone Encounter (Signed)
Pt has been informed.

## 2018-07-29 NOTE — Telephone Encounter (Signed)
[place] for re check with LAB only in 4 weeks

## 2018-07-29 NOTE — Addendum Note (Signed)
Addended by: Biagio Borg on: 07/29/2018 12:56 PM   Modules accepted: Orders

## 2018-07-29 NOTE — Telephone Encounter (Signed)
Pt returnedcall to discuss lab work, Almyra Free, Glass blower/designer, had given her results ut had a follow up question concerning her thyroid recheck. Should she come in for a follow up in 3 or 6 months? Please advise.  She would like for the appt be scheduled and appt time/date left on her VM since she is at work.

## 2018-08-31 ENCOUNTER — Other Ambulatory Visit: Payer: Self-pay | Admitting: Internal Medicine

## 2018-08-31 ENCOUNTER — Other Ambulatory Visit (INDEPENDENT_AMBULATORY_CARE_PROVIDER_SITE_OTHER): Payer: Managed Care, Other (non HMO)

## 2018-08-31 ENCOUNTER — Encounter: Payer: Self-pay | Admitting: Internal Medicine

## 2018-08-31 DIAGNOSIS — E039 Hypothyroidism, unspecified: Secondary | ICD-10-CM

## 2018-08-31 LAB — TSH: TSH: 5.42 u[IU]/mL — AB (ref 0.35–4.50)

## 2018-08-31 LAB — T4, FREE: Free T4: 0.74 ng/dL (ref 0.60–1.60)

## 2018-08-31 MED ORDER — LEVOTHYROXINE SODIUM 75 MCG PO TABS: 75.0000 ug | ORAL_TABLET | Freq: Every day | ORAL | 3 refills | Status: DC

## 2018-09-01 ENCOUNTER — Telehealth: Payer: Self-pay

## 2018-09-01 ENCOUNTER — Telehealth: Payer: Self-pay | Admitting: *Deleted

## 2018-09-01 NOTE — Telephone Encounter (Signed)
Called pt, LVM.   

## 2018-09-01 NOTE — Telephone Encounter (Signed)
Noted  

## 2018-09-01 NOTE — Telephone Encounter (Signed)
-----   Message from Biagio Borg, MD sent at 08/31/2018  5:32 PM EST ----- Letter sent, cont same tx except  The test results show that your current treatment is OK, except although the thyroid is improved, the TSH is still just a bit too off to let go, so we should increase your thyroid medication from 50 mcg per day, to 75 mcg per day.  Please also plan to return In 1 month for repeat thyroid testing (just go to the lab).    Michelle Davis to please inform pt, I will do rx and lab order

## 2018-09-01 NOTE — Telephone Encounter (Signed)
I called pt to let her know the 75 mcg dose of Synthroid was called in by Dr. Jenny Reichmann to her pharmacy.    She was going to use her 50 mcg pills and take 1 and 1/2 pills.   She will go pick up the 75 mcg dose instead.  I will route this to Dr. Gwynn Burly office.

## 2018-10-26 ENCOUNTER — Other Ambulatory Visit (INDEPENDENT_AMBULATORY_CARE_PROVIDER_SITE_OTHER): Payer: Managed Care, Other (non HMO)

## 2018-10-26 DIAGNOSIS — E039 Hypothyroidism, unspecified: Secondary | ICD-10-CM | POA: Diagnosis not present

## 2018-10-26 LAB — TSH: TSH: 5.66 u[IU]/mL — ABNORMAL HIGH (ref 0.35–4.50)

## 2018-10-26 LAB — T4, FREE: Free T4: 0.86 ng/dL (ref 0.60–1.60)

## 2018-10-27 ENCOUNTER — Telehealth: Payer: Self-pay

## 2018-10-27 NOTE — Telephone Encounter (Signed)
-----   Message from Biagio Borg, MD sent at 10/26/2018  5:40 PM EST ----- OK for shirron to contact pt; the TSH thyroid test is essentially the same as the last test, and it appears the may not have increased her thyroid medication from 50 to 75 mcg as recommended;  Please ask pt if she has changed to the 75 mcg yet.  This is a minor issue and is not at all likely to be associated with specific symptoms, but we like to try to get the TSH in the normal range

## 2018-10-27 NOTE — Telephone Encounter (Signed)
Pt has been informed of results and expressed understanding.  °

## 2018-10-27 NOTE — Telephone Encounter (Signed)
Spoke with pt, she stated that she was informed by the pharmacist that she could cut her 77mcg in half in order to not waste the 90 day supply she has just picked up before the dosage increase. She stated that is what she has been doing, taking a whole 62mcg and a 15mcg, to make the 92mcg. I told her that I would inform PCP and see what he would like to do or if he would like to change medication dosage again and follow up with her. I let pt know that today is a half day due to holidays so if I do not follow up with her u end of office today that I would follow up with her once we are back from new years.

## 2018-10-27 NOTE — Telephone Encounter (Signed)
I suspect there is some difficulty with taking the correct dose.  Please ask pt to simply fill and take the 75 mcg per day already sent to the pharmacy, to avoid any errors with breaking the pills

## 2019-01-07 ENCOUNTER — Other Ambulatory Visit (INDEPENDENT_AMBULATORY_CARE_PROVIDER_SITE_OTHER): Payer: Managed Care, Other (non HMO)

## 2019-01-07 ENCOUNTER — Encounter: Payer: Self-pay | Admitting: Internal Medicine

## 2019-01-07 DIAGNOSIS — R7302 Impaired glucose tolerance (oral): Secondary | ICD-10-CM

## 2019-01-07 DIAGNOSIS — Z0000 Encounter for general adult medical examination without abnormal findings: Secondary | ICD-10-CM

## 2019-01-07 LAB — CBC WITH DIFFERENTIAL/PLATELET
Basophils Absolute: 0 10*3/uL (ref 0.0–0.1)
Basophils Relative: 0.5 % (ref 0.0–3.0)
EOS PCT: 4.8 % (ref 0.0–5.0)
Eosinophils Absolute: 0.4 10*3/uL (ref 0.0–0.7)
HEMATOCRIT: 43.3 % (ref 36.0–46.0)
Hemoglobin: 14.5 g/dL (ref 12.0–15.0)
LYMPHS PCT: 43.8 % (ref 12.0–46.0)
Lymphs Abs: 3.5 10*3/uL (ref 0.7–4.0)
MCHC: 33.6 g/dL (ref 30.0–36.0)
MCV: 89.6 fl (ref 78.0–100.0)
MONOS PCT: 9.3 % (ref 3.0–12.0)
Monocytes Absolute: 0.7 10*3/uL (ref 0.1–1.0)
NEUTROS ABS: 3.3 10*3/uL (ref 1.4–7.7)
Neutrophils Relative %: 41.6 % — ABNORMAL LOW (ref 43.0–77.0)
PLATELETS: 201 10*3/uL (ref 150.0–400.0)
RBC: 4.83 Mil/uL (ref 3.87–5.11)
RDW: 12.1 % (ref 11.5–15.5)
WBC: 8 10*3/uL (ref 4.0–10.5)

## 2019-01-07 LAB — BASIC METABOLIC PANEL
BUN: 14 mg/dL (ref 6–23)
CALCIUM: 9.3 mg/dL (ref 8.4–10.5)
CHLORIDE: 103 meq/L (ref 96–112)
CO2: 24 mEq/L (ref 19–32)
CREATININE: 0.77 mg/dL (ref 0.40–1.20)
GFR: 81.1 mL/min (ref >60.00)
Glucose, Bld: 114 mg/dL — ABNORMAL HIGH (ref 70–99)
Potassium: 4.1 mEq/L (ref 3.5–5.1)
Sodium: 138 mEq/L (ref 135–145)

## 2019-01-07 LAB — URINALYSIS, ROUTINE W REFLEX MICROSCOPIC
Bilirubin Urine: NEGATIVE
Ketones, ur: NEGATIVE
Leukocytes,Ua: NEGATIVE
Nitrite: NEGATIVE
PH: 5.5 (ref 5.0–8.0)
TOTAL PROTEIN, URINE-UPE24: NEGATIVE
UROBILINOGEN UA: 0.2 (ref 0.0–1.0)
Urine Glucose: NEGATIVE

## 2019-01-07 LAB — HEMOGLOBIN A1C: Hgb A1c MFr Bld: 6.7 % — ABNORMAL HIGH (ref 4.6–6.5)

## 2019-01-07 LAB — LIPID PANEL
CHOLESTEROL: 176 mg/dL (ref 0–200)
HDL: 56.8 mg/dL (ref >39.00)
LDL Cholesterol: 94 mg/dL (ref 0–99)
NonHDL: 119.18
TRIGLYCERIDES: 125 mg/dL (ref 0.0–149.0)
Total CHOL/HDL Ratio: 3
VLDL: 25 mg/dL (ref 0.0–40.0)

## 2019-01-07 LAB — HEPATIC FUNCTION PANEL
ALBUMIN: 4.2 g/dL (ref 3.5–5.2)
ALT: 28 U/L (ref 0–35)
AST: 31 U/L (ref 0–37)
Alkaline Phosphatase: 58 U/L (ref 39–117)
Bilirubin, Direct: 0 mg/dL (ref 0.0–0.3)
TOTAL PROTEIN: 7.1 g/dL (ref 6.0–8.3)
Total Bilirubin: 0.4 mg/dL (ref 0.2–1.2)

## 2019-01-07 LAB — TSH: TSH: 4.35 u[IU]/mL (ref 0.35–4.50)

## 2019-04-27 DIAGNOSIS — E079 Disorder of thyroid, unspecified: Secondary | ICD-10-CM | POA: Insufficient documentation

## 2019-04-27 DIAGNOSIS — E7800 Pure hypercholesterolemia, unspecified: Secondary | ICD-10-CM | POA: Insufficient documentation

## 2019-04-27 DIAGNOSIS — I10 Essential (primary) hypertension: Secondary | ICD-10-CM | POA: Insufficient documentation

## 2019-07-26 ENCOUNTER — Other Ambulatory Visit: Payer: Self-pay | Admitting: Internal Medicine

## 2019-07-27 MED ORDER — AMLODIPINE BESYLATE 5 MG PO TABS: 5.0000 mg | ORAL_TABLET | Freq: Every day | ORAL | 0 refills | Status: DC

## 2019-07-27 MED ORDER — IRBESARTAN 150 MG PO TABS: 150.0000 mg | ORAL_TABLET | Freq: Every day | ORAL | 0 refills | Status: DC

## 2019-07-27 NOTE — Telephone Encounter (Signed)
Pt called and scheduled med refill for 10/01.  Pt states she is taking her last pill today and wants to know if a refill can be sent in for her.

## 2019-07-27 NOTE — Telephone Encounter (Signed)
Appointment has been scheduled.

## 2019-07-27 NOTE — Telephone Encounter (Signed)
30 day sent to pharmacy. Pt needs OV - she is overdue.

## 2019-07-27 NOTE — Addendum Note (Signed)
Addended by: Juliet Rude on: 07/27/2019 10:17 AM   Modules accepted: Orders

## 2019-07-29 ENCOUNTER — Ambulatory Visit (INDEPENDENT_AMBULATORY_CARE_PROVIDER_SITE_OTHER): Payer: 59 | Admitting: Internal Medicine

## 2019-07-29 ENCOUNTER — Encounter: Payer: Self-pay | Admitting: Internal Medicine

## 2019-07-29 DIAGNOSIS — E611 Iron deficiency: Secondary | ICD-10-CM

## 2019-07-29 DIAGNOSIS — R7302 Impaired glucose tolerance (oral): Secondary | ICD-10-CM | POA: Diagnosis not present

## 2019-07-29 DIAGNOSIS — I10 Essential (primary) hypertension: Secondary | ICD-10-CM | POA: Diagnosis not present

## 2019-07-29 DIAGNOSIS — Z0000 Encounter for general adult medical examination without abnormal findings: Secondary | ICD-10-CM

## 2019-07-29 DIAGNOSIS — E538 Deficiency of other specified B group vitamins: Secondary | ICD-10-CM | POA: Diagnosis not present

## 2019-07-29 DIAGNOSIS — E559 Vitamin D deficiency, unspecified: Secondary | ICD-10-CM

## 2019-07-29 MED ORDER — AMLODIPINE BESYLATE 5 MG PO TABS: 5.0000 mg | ORAL_TABLET | Freq: Every day | ORAL | 3 refills | Status: DC

## 2019-07-29 MED ORDER — IRBESARTAN 150 MG PO TABS: 150.0000 mg | ORAL_TABLET | Freq: Every day | ORAL | 3 refills | Status: DC

## 2019-07-29 MED ORDER — LEVOTHYROXINE SODIUM 75 MCG PO TABS: 75.0000 ug | ORAL_TABLET | Freq: Every day | ORAL | 3 refills | Status: DC

## 2019-07-29 MED ORDER — ROSUVASTATIN CALCIUM 10 MG PO TABS: 10.0000 mg | ORAL_TABLET | Freq: Every day | ORAL | 3 refills | Status: DC

## 2019-07-29 NOTE — Addendum Note (Signed)
Addended by: Biagio Borg on: 07/29/2019 12:56 PM   Modules accepted: Orders

## 2019-07-29 NOTE — Assessment & Plan Note (Signed)

## 2019-07-29 NOTE — Assessment & Plan Note (Signed)
Pt to check BP at home, cont same tx

## 2019-07-29 NOTE — Assessment & Plan Note (Signed)
stable overall by history and exam, recent data reviewed with pt, and pt to continue medical treatment as before,  to f/u any worsening symptoms or concerns  

## 2019-07-29 NOTE — Progress Notes (Signed)
Patient ID: Michelle Davis, female   DOB: [birth year], 43 y.o.   MRN: [id number]  Virtual Visit via Video Note  I connected with Michelle Davis on 07/29/19 at 10:40 AM EDT by a video enabled telemedicine application and verified that I am speaking with the correct person using two identifiers.  Location: Patient: at home Provider: at office   I discussed the limitations of evaluation and management by telemedicine and the availability of in person appointments. The patient expressed understanding and agreed to proceed.  History of Present Illness: Here for wellness and f/u;  Overall doing ok;  Pt denies Chest pain, worsening SOB, DOE, wheezing, orthopnea, PND, worsening LE edema, palpitations, dizziness or syncope.  Pt denies neurological change such as new headache, facial or extremity weakness.  Pt denies polydipsia, polyuria, or low sugar symptoms. Pt states overall good compliance with treatment and medications, good tolerability, and has been trying to follow appropriate diet.  Pt denies worsening depressive symptoms, suicidal ideation or panic. No fever, night sweats, wt loss, loss of appetite, or other constitutional symptoms.  Pt states good ability with ADL's, has low fall risk, home safety reviewed and adequate, no other significant changes in hearing or vision, and only occasionally active with exercise.  No new complaints.  BP at home has been < 140/90 though admits not recently, and was mildly high at recent dentist visit Past Medical History:  Diagnosis Date  . Hypertension   . Hypothyroidism 03/05/2016   Past Surgical History:  Procedure Laterality Date  . ABDOMINAL HYSTERECTOMY  06/02/2017   (total)  . TUBAL LIGATION  02/2000    reports that she has never smoked. She has never used smokeless tobacco. She reports that she does not drink alcohol or use drugs. family history includes Hypertension in an other family member. No Known Allergies Current Outpatient Medications on  File Prior to Visit  Medication Sig Dispense Refill  . amLODipine (NORVASC) 5 MG tablet Take 1 tablet (5 mg total) by mouth daily. 30 tablet 0  . aspirin 81 MG EC tablet Take 1 tablet (81 mg total) by mouth daily. Swallow whole. 30 tablet 12  . estrogens, conjugated, (PREMARIN) 0.625 MG tablet Take 0.625 mg by mouth daily. Take daily for 21 days then do not take for 7 days.    . irbesartan (AVAPRO) 150 MG tablet Take 1 tablet (150 mg total) by mouth daily. 30 tablet 0  . levothyroxine (SYNTHROID, LEVOTHROID) 75 MCG tablet Take 1 tablet (75 mcg total) by mouth daily. 90 tablet 3  . rosuvastatin (CRESTOR) 10 MG tablet Take 1 tablet (10 mg total) by mouth daily. 90 tablet 3   No current facility-administered medications on file prior to visit.     Observations/Objective: Alert, NAD, appropriate mood and affect, resps normal, cn 2-12 intact, moves all 4s, no visible rash or swelling Lab Results  Component Value Date   WBC 8.0 01/07/2019   HGB 14.5 01/07/2019   HCT 43.3 01/07/2019   PLT 201.0 01/07/2019   GLUCOSE 114 (H) 01/07/2019   CHOL 176 01/07/2019   TRIG 125.0 01/07/2019   HDL 56.80 01/07/2019   LDLDIRECT 172.9 06/30/2014   LDLCALC 94 01/07/2019   ALT 28 01/07/2019   AST 31 01/07/2019   NA 138 01/07/2019   K 4.1 01/07/2019   CL 103 01/07/2019   CREATININE 0.77 01/07/2019   BUN 14 01/07/2019   CO2 24 01/07/2019   TSH 4.35 01/07/2019   HGBA1C 6.7 (H) 01/07/2019  Assessment and Plan: See notes  Follow Up Instructions: See notes   I discussed the assessment and treatment plan with the patient. The patient was provided an opportunity to ask questions and all were answered. The patient agreed with the plan and demonstrated an understanding of the instructions.   The patient was advised to call back or seek an in-person evaluation if the symptoms worsen or if the condition fails to improve as anticipated.   Cathlean Cower, MD

## 2019-08-13 ENCOUNTER — Other Ambulatory Visit (INDEPENDENT_AMBULATORY_CARE_PROVIDER_SITE_OTHER): Payer: 59

## 2019-08-13 ENCOUNTER — Encounter: Payer: Self-pay | Admitting: Internal Medicine

## 2019-08-13 ENCOUNTER — Other Ambulatory Visit: Payer: Self-pay | Admitting: Internal Medicine

## 2019-08-13 DIAGNOSIS — E611 Iron deficiency: Secondary | ICD-10-CM | POA: Diagnosis not present

## 2019-08-13 DIAGNOSIS — E559 Vitamin D deficiency, unspecified: Secondary | ICD-10-CM

## 2019-08-13 DIAGNOSIS — R7302 Impaired glucose tolerance (oral): Secondary | ICD-10-CM

## 2019-08-13 DIAGNOSIS — E538 Deficiency of other specified B group vitamins: Secondary | ICD-10-CM | POA: Diagnosis not present

## 2019-08-13 DIAGNOSIS — Z0000 Encounter for general adult medical examination without abnormal findings: Secondary | ICD-10-CM

## 2019-08-13 LAB — MICROALBUMIN / CREATININE URINE RATIO
Creatinine,U: 137.4 mg/dL
Microalb Creat Ratio: 0.5 mg/g (ref 0.0–30.0)
Microalb, Ur: <0.7 mg/dL (ref 0.0–1.9)

## 2019-08-13 LAB — BASIC METABOLIC PANEL
BUN: 11 mg/dL (ref 6–23)
CO2: 28 mEq/L (ref 19–32)
Calcium: 9.3 mg/dL (ref 8.4–10.5)
Chloride: 102 mEq/L (ref 96–112)
Creatinine, Ser: 0.82 mg/dL (ref 0.40–1.20)
GFR: 75.22 mL/min (ref >60.00)
Glucose, Bld: 133 mg/dL — ABNORMAL HIGH (ref 70–99)
Potassium: 3.9 mEq/L (ref 3.5–5.1)
Sodium: 139 mEq/L (ref 135–145)

## 2019-08-13 LAB — HEPATIC FUNCTION PANEL
ALT: 45 U/L — ABNORMAL HIGH (ref 0–35)
AST: 44 U/L — ABNORMAL HIGH (ref 0–37)
Albumin: 4.2 g/dL (ref 3.5–5.2)
Alkaline Phosphatase: 68 U/L (ref 39–117)
Bilirubin, Direct: 0.1 mg/dL (ref 0.0–0.3)
Total Bilirubin: 0.4 mg/dL (ref 0.2–1.2)
Total Protein: 7.1 g/dL (ref 6.0–8.3)

## 2019-08-13 LAB — LIPID PANEL
Cholesterol: 177 mg/dL (ref 0–200)
HDL: 45 mg/dL (ref >39.00)
LDL Cholesterol: 98 mg/dL (ref 0–99)
NonHDL: 131.64
Total CHOL/HDL Ratio: 4
Triglycerides: 168 mg/dL — ABNORMAL HIGH (ref 0.0–149.0)
VLDL: 33.6 mg/dL (ref 0.0–40.0)

## 2019-08-13 LAB — URINALYSIS, ROUTINE W REFLEX MICROSCOPIC
Bilirubin Urine: NEGATIVE
Ketones, ur: NEGATIVE
Leukocytes,Ua: NEGATIVE
Nitrite: NEGATIVE
Specific Gravity, Urine: 1.02 (ref 1.000–1.030)
Total Protein, Urine: NEGATIVE
Urine Glucose: NEGATIVE
Urobilinogen, UA: 0.2 (ref 0.0–1.0)
WBC, UA: NONE SEEN (ref 0–?)
pH: 6 (ref 5.0–8.0)

## 2019-08-13 LAB — CBC WITH DIFFERENTIAL/PLATELET
Basophils Absolute: 0.1 10*3/uL (ref 0.0–0.1)
Basophils Relative: 0.7 % (ref 0.0–3.0)
Eosinophils Absolute: 0.4 10*3/uL (ref 0.0–0.7)
Eosinophils Relative: 5.2 % — ABNORMAL HIGH (ref 0.0–5.0)
HCT: 43.1 % (ref 36.0–46.0)
Hemoglobin: 14.5 g/dL (ref 12.0–15.0)
Lymphocytes Relative: 43.7 % (ref 12.0–46.0)
Lymphs Abs: 3.5 10*3/uL (ref 0.7–4.0)
MCHC: 33.7 g/dL (ref 30.0–36.0)
MCV: 90 fl (ref 78.0–100.0)
Monocytes Absolute: 0.7 10*3/uL (ref 0.1–1.0)
Monocytes Relative: 8.9 % (ref 3.0–12.0)
Neutro Abs: 3.3 10*3/uL (ref 1.4–7.7)
Neutrophils Relative %: 41.5 % — ABNORMAL LOW (ref 43.0–77.0)
Platelets: 180 10*3/uL (ref 150.0–400.0)
RBC: 4.79 Mil/uL (ref 3.87–5.11)
RDW: 12.4 % (ref 11.5–15.5)
WBC: 8 10*3/uL (ref 4.0–10.5)

## 2019-08-13 LAB — TSH: TSH: 2.89 u[IU]/mL (ref 0.35–4.50)

## 2019-08-13 LAB — VITAMIN B12: Vitamin B-12: 267 pg/mL (ref 211–911)

## 2019-08-13 LAB — HEMOGLOBIN A1C: Hgb A1c MFr Bld: 7.1 % — ABNORMAL HIGH (ref 4.6–6.5)

## 2019-08-13 LAB — IBC PANEL
Iron: 96 ug/dL (ref 42–145)
Saturation Ratios: 30.1 % (ref 20.0–50.0)
Transferrin: 228 mg/dL (ref 212.0–360.0)

## 2019-08-13 LAB — VITAMIN D 25 HYDROXY (VIT D DEFICIENCY, FRACTURES): VITD: 21.65 ng/mL — ABNORMAL LOW (ref 30.00–100.00)

## 2019-08-13 MED ORDER — VITAMIN D (ERGOCALCIFEROL) 1.25 MG (50000 UNIT) PO CAPS: 50000.0000 [IU] | ORAL_CAPSULE | ORAL | 0 refills | Status: DC

## 2019-08-13 MED ORDER — METFORMIN HCL 500 MG PO TABS: 500.0000 mg | ORAL_TABLET | Freq: Every day | ORAL | 3 refills | Status: DC

## 2019-08-13 MED ORDER — ROSUVASTATIN CALCIUM 20 MG PO TABS: 20.0000 mg | ORAL_TABLET | Freq: Every day | ORAL | 3 refills | Status: DC

## 2019-11-13 ENCOUNTER — Other Ambulatory Visit: Payer: Self-pay | Admitting: Internal Medicine

## 2019-11-13 NOTE — Telephone Encounter (Signed)
No need further high dose vit d  Plan to change to OTC Vitamin D3 at 2000 units per day, indefinitely.

## 2020-02-01 ENCOUNTER — Other Ambulatory Visit (INDEPENDENT_AMBULATORY_CARE_PROVIDER_SITE_OTHER): Payer: Managed Care, Other (non HMO)

## 2020-02-01 DIAGNOSIS — R7302 Impaired glucose tolerance (oral): Secondary | ICD-10-CM

## 2020-02-01 LAB — HEPATIC FUNCTION PANEL
ALT: 40 U/L — ABNORMAL HIGH (ref 0–35)
AST: 51 U/L — ABNORMAL HIGH (ref 0–37)
Albumin: 4.1 g/dL (ref 3.5–5.2)
Alkaline Phosphatase: 62 U/L (ref 39–117)
Bilirubin, Direct: 0.1 mg/dL (ref 0.0–0.3)
Total Bilirubin: 0.4 mg/dL (ref 0.2–1.2)
Total Protein: 7 g/dL (ref 6.0–8.3)

## 2020-02-01 LAB — BASIC METABOLIC PANEL
BUN: 10 mg/dL (ref 6–23)
CO2: 28 mEq/L (ref 19–32)
Calcium: 9.1 mg/dL (ref 8.4–10.5)
Chloride: 103 mEq/L (ref 96–112)
Creatinine, Ser: 0.75 mg/dL (ref 0.40–1.20)
GFR: 83.2 mL/min (ref >60.00)
Glucose, Bld: 147 mg/dL — ABNORMAL HIGH (ref 70–99)
Potassium: 3.9 mEq/L (ref 3.5–5.1)
Sodium: 140 mEq/L (ref 135–145)

## 2020-02-01 LAB — LIPID PANEL
Cholesterol: 153 mg/dL (ref 0–200)
HDL: 57.5 mg/dL (ref >39.00)
LDL Cholesterol: 78 mg/dL (ref 0–99)
NonHDL: 95.43
Total CHOL/HDL Ratio: 3
Triglycerides: 88 mg/dL (ref 0.0–149.0)
VLDL: 17.6 mg/dL (ref 0.0–40.0)

## 2020-02-01 LAB — HEMOGLOBIN A1C: Hgb A1c MFr Bld: 7 % — ABNORMAL HIGH (ref 4.6–6.5)

## 2020-02-02 ENCOUNTER — Ambulatory Visit (INDEPENDENT_AMBULATORY_CARE_PROVIDER_SITE_OTHER): Payer: Managed Care, Other (non HMO) | Admitting: Internal Medicine

## 2020-02-02 ENCOUNTER — Encounter: Payer: Self-pay | Admitting: Internal Medicine

## 2020-02-02 ENCOUNTER — Other Ambulatory Visit: Payer: Self-pay

## 2020-02-02 VITALS — BP 152/80 | HR 81 | Temp 99.1°F | Ht 63.0 in | Wt 278.8 lb

## 2020-02-02 DIAGNOSIS — E039 Hypothyroidism, unspecified: Secondary | ICD-10-CM

## 2020-02-02 DIAGNOSIS — Z0000 Encounter for general adult medical examination without abnormal findings: Secondary | ICD-10-CM

## 2020-02-02 DIAGNOSIS — I10 Essential (primary) hypertension: Secondary | ICD-10-CM

## 2020-02-02 DIAGNOSIS — E538 Deficiency of other specified B group vitamins: Secondary | ICD-10-CM

## 2020-02-02 DIAGNOSIS — E785 Hyperlipidemia, unspecified: Secondary | ICD-10-CM

## 2020-02-02 DIAGNOSIS — E119 Type 2 diabetes mellitus without complications: Secondary | ICD-10-CM | POA: Diagnosis not present

## 2020-02-02 DIAGNOSIS — E559 Vitamin D deficiency, unspecified: Secondary | ICD-10-CM

## 2020-02-02 DIAGNOSIS — E611 Iron deficiency: Secondary | ICD-10-CM

## 2020-02-02 MED ORDER — METFORMIN HCL ER 500 MG PO TB24: 1000.0000 mg | ORAL_TABLET | Freq: Every day | ORAL | 3 refills | Status: DC

## 2020-02-02 NOTE — Assessment & Plan Note (Signed)
stable overall by history and exam, recent data reviewed with pt, and pt to continue medical treatment as before,  to f/u any worsening symptoms or concerns  

## 2020-02-02 NOTE — Assessment & Plan Note (Addendum)
Uncontrolled, to increase metformin ER 500 to 2 qam  I spent 31 minutes in preparing to see the patient by review of recent labs, imaging and procedures, obtaining and reviewing separately obtained history, communicating with the patient and family or caregiver, ordering medications, tests or procedures, and documenting clinical information in the EHR including the differential Dx, treatment, and any further evaluation and other management of  DM, HTn, HLD, hypothyroidism

## 2020-02-02 NOTE — Progress Notes (Signed)
Subjective:    Patient ID: Michelle Davis, female    DOB: [birth year], 44 y.o.   MRN: [id number]  HPI Here to f/u; overall doing ok,  Pt denies chest pain, increasing sob or doe, wheezing, orthopnea, PND, increased LE swelling, palpitations, dizziness or syncope.  Pt denies new neurological symptoms such as new headache, or facial or extremity weakness or numbness.  Pt denies polydipsia, polyuria, or low sugar episode.  Pt states overall good compliance with meds, mostly trying to follow appropriate diet, with wt overall stable,  but little exercise however. Gained wt with pandemic.  BP at home have been <140/90 but not checkedl lately.  Taking the statin every other day.  Denies hyper or hypo thyroid symptoms such as voice, skin or hair change. Wt Readings from Last 3 Encounters:  02/02/20 278 lb 12.8 oz (126.5 kg)  07/14/18 264 lb (119.7 kg)  06/24/17 260 lb (117.9 kg)    BP Readings from Last 3 Encounters:  02/02/20 (!) 152/80  07/14/18 124/86  06/24/17 128/88   Past Medical History:  Diagnosis Date  . Hypertension   . Hypothyroidism 03/05/2016   Past Surgical History:  Procedure Laterality Date  . ABDOMINAL HYSTERECTOMY  06/02/2017   (total)  . TUBAL LIGATION  02/2000    reports that she has never smoked. She has never used smokeless tobacco. She reports that she does not drink alcohol or use drugs. family history includes Hypertension in an other family member. No Known Allergies Current Outpatient Medications on File Prior to Visit  Medication Sig Dispense Refill  . amLODipine (NORVASC) 5 MG tablet Take 1 tablet (5 mg total) by mouth daily. 90 tablet 3  . aspirin 81 MG EC tablet Take 1 tablet (81 mg total) by mouth daily. Swallow whole. 30 tablet 12  . estrogens, conjugated, (PREMARIN) 0.625 MG tablet Take 0.625 mg by mouth daily. Take daily for 21 days then do not take for 7 days.    . irbesartan (AVAPRO) 150 MG tablet Take 1 tablet (150 mg total) by mouth daily. 90 tablet  3  . levothyroxine (SYNTHROID) 75 MCG tablet Take 1 tablet (75 mcg total) by mouth daily. 90 tablet 3  . rosuvastatin (CRESTOR) 20 MG tablet Take 1 tablet (20 mg total) by mouth daily. 90 tablet 3  . Vitamin D, Ergocalciferol, (DRISDOL) 1.25 MG (50000 UT) CAPS capsule Take 1 capsule (50,000 Units total) by mouth every 7 (seven) days. (Patient not taking: Reported on 02/02/2020) 12 capsule 0   No current facility-administered medications on file prior to visit.   Review of Systems All otherwise neg per pt     Objective:   Physical Exam BP (!) 152/80   Pulse 81   Temp 99.1 F (37.3 C)   Ht 5\' 3"  (1.6 m)   Wt 278 lb 12.8 oz (126.5 kg)   SpO2 99%   BMI 49.39 kg/m  VS noted,  Constitutional: Pt appears in NAD HENT: Head: NCAT.  Right Ear: External ear normal.  Left Ear: External ear normal.  Eyes: . Pupils are equal, round, and reactive to light. Conjunctivae and EOM are normal Nose: without d/c or deformity Neck: Neck supple. Gross normal ROM Cardiovascular: Normal rate and regular rhythm.   Pulmonary/Chest: Effort normal and breath sounds without rales or wheezing.  Abd:  Soft, NT, ND, + BS, no organomegaly Neurological: Pt is alert. At baseline orientation, motor grossly intact Skin: Skin is warm. No rashes, other new lesions, no LE edema Psychiatric:  Pt behavior is normal without agitation  All otherwise neg per pt Lab Results  Component Value Date   WBC 8.0 08/13/2019   HGB 14.5 08/13/2019   HCT 43.1 08/13/2019   PLT 180.0 08/13/2019   GLUCOSE 147 (H) 02/01/2020   CHOL 153 02/01/2020   TRIG 88.0 02/01/2020   HDL 57.50 02/01/2020   LDLDIRECT 172.9 06/30/2014   LDLCALC 78 02/01/2020   ALT 40 (H) 02/01/2020   AST 51 (H) 02/01/2020   NA 140 02/01/2020   K 3.9 02/01/2020   CL 103 02/01/2020   CREATININE 0.75 02/01/2020   BUN 10 02/01/2020   CO2 28 02/01/2020   TSH 2.89 08/13/2019   HGBA1C 7.0 (H) 02/01/2020   MICROALBUR <0.7 08/13/2019        Assessment &  Plan:

## 2020-02-02 NOTE — Patient Instructions (Signed)
Ok to increase the metformin ER to 2 pill in the AM  Please check your BP at home every day for 2 wks and call if the BP is more then 140/90 on average  Please continue all other medications as before, and refills have been done if requested.  Please have the pharmacy call with any other refills you may need.  Please continue your efforts at being more active, low cholesterol diet, and weight control.  Please keep your appointments with your specialists as you may have planned  Please make an Appointment to return in 6 months, or sooner if needed, also with Lab Appointment for testing done 3-5 days before at the [facility] (so this is for TWO appointments - please see the scheduling desk as you leave)

## 2020-02-02 NOTE — Assessment & Plan Note (Signed)
Mild elevated today ? Due to wt gain, pt declines med change, for f/u BP at home and call in 2 wks if ave > 140/90

## 2020-06-07 DIAGNOSIS — R7303 Prediabetes: Secondary | ICD-10-CM | POA: Insufficient documentation

## 2020-08-01 ENCOUNTER — Other Ambulatory Visit (INDEPENDENT_AMBULATORY_CARE_PROVIDER_SITE_OTHER): Payer: 59

## 2020-08-01 DIAGNOSIS — E119 Type 2 diabetes mellitus without complications: Secondary | ICD-10-CM

## 2020-08-01 DIAGNOSIS — E559 Vitamin D deficiency, unspecified: Secondary | ICD-10-CM | POA: Diagnosis not present

## 2020-08-01 DIAGNOSIS — E538 Deficiency of other specified B group vitamins: Secondary | ICD-10-CM

## 2020-08-01 DIAGNOSIS — E611 Iron deficiency: Secondary | ICD-10-CM

## 2020-08-01 DIAGNOSIS — Z0000 Encounter for general adult medical examination without abnormal findings: Secondary | ICD-10-CM | POA: Diagnosis not present

## 2020-08-01 LAB — BASIC METABOLIC PANEL
BUN: 15 mg/dL (ref 6–23)
CO2: 25 mEq/L (ref 19–32)
Calcium: 9.2 mg/dL (ref 8.4–10.5)
Chloride: 104 mEq/L (ref 96–112)
Creatinine, Ser: 0.83 mg/dL (ref 0.40–1.20)
GFR: 73.86 mL/min (ref >60.00)
Glucose, Bld: 146 mg/dL — ABNORMAL HIGH (ref 70–99)
Potassium: 4 mEq/L (ref 3.5–5.1)
Sodium: 139 mEq/L (ref 135–145)

## 2020-08-01 LAB — CBC WITH DIFFERENTIAL/PLATELET
Basophils Absolute: 0 10*3/uL (ref 0.0–0.1)
Basophils Relative: 0.4 % (ref 0.0–3.0)
Eosinophils Absolute: 0.3 10*3/uL (ref 0.0–0.7)
Eosinophils Relative: 4.4 % (ref 0.0–5.0)
HCT: 42.9 % (ref 36.0–46.0)
Hemoglobin: 14.3 g/dL (ref 12.0–15.0)
Lymphocytes Relative: 43.7 % (ref 12.0–46.0)
Lymphs Abs: 3 10*3/uL (ref 0.7–4.0)
MCHC: 33.3 g/dL (ref 30.0–36.0)
MCV: 91.2 fl (ref 78.0–100.0)
Monocytes Absolute: 0.5 10*3/uL (ref 0.1–1.0)
Monocytes Relative: 7.5 % (ref 3.0–12.0)
Neutro Abs: 3 10*3/uL (ref 1.4–7.7)
Neutrophils Relative %: 44 % (ref 43.0–77.0)
Platelets: 193 10*3/uL (ref 150.0–400.0)
RBC: 4.71 Mil/uL (ref 3.87–5.11)
RDW: 12.5 % (ref 11.5–15.5)
WBC: 6.9 10*3/uL (ref 4.0–10.5)

## 2020-08-01 LAB — HEPATIC FUNCTION PANEL
ALT: 27 U/L (ref 0–35)
AST: 28 U/L (ref 0–37)
Albumin: 4.2 g/dL (ref 3.5–5.2)
Alkaline Phosphatase: 52 U/L (ref 39–117)
Bilirubin, Direct: 0.1 mg/dL (ref 0.0–0.3)
Total Bilirubin: 0.4 mg/dL (ref 0.2–1.2)
Total Protein: 7.2 g/dL (ref 6.0–8.3)

## 2020-08-01 LAB — VITAMIN D 25 HYDROXY (VIT D DEFICIENCY, FRACTURES): VITD: 29.32 ng/mL — ABNORMAL LOW (ref 30.00–100.00)

## 2020-08-01 LAB — LIPID PANEL
Cholesterol: 154 mg/dL (ref 0–200)
HDL: 55 mg/dL (ref >39.00)
LDL Cholesterol: 80 mg/dL (ref 0–99)
NonHDL: 98.63
Total CHOL/HDL Ratio: 3
Triglycerides: 93 mg/dL (ref 0.0–149.0)
VLDL: 18.6 mg/dL (ref 0.0–40.0)

## 2020-08-01 LAB — IBC PANEL
Iron: 89 ug/dL (ref 42–145)
Saturation Ratios: 25.1 % (ref 20.0–50.0)
Transferrin: 253 mg/dL (ref 212.0–360.0)

## 2020-08-01 LAB — MICROALBUMIN / CREATININE URINE RATIO
Creatinine,U: 215 mg/dL
Microalb Creat Ratio: 0.4 mg/g (ref 0.0–30.0)
Microalb, Ur: 0.9 mg/dL (ref 0.0–1.9)

## 2020-08-01 LAB — VITAMIN B12: Vitamin B-12: 228 pg/mL (ref 211–911)

## 2020-08-01 LAB — HEMOGLOBIN A1C: Hgb A1c MFr Bld: 6.6 % — ABNORMAL HIGH (ref 4.6–6.5)

## 2020-08-01 LAB — TSH: TSH: 5.68 u[IU]/mL — ABNORMAL HIGH (ref 0.35–4.50)

## 2020-08-03 ENCOUNTER — Other Ambulatory Visit: Payer: Self-pay

## 2020-08-03 ENCOUNTER — Ambulatory Visit: Payer: Managed Care, Other (non HMO) | Admitting: Internal Medicine

## 2020-08-03 ENCOUNTER — Ambulatory Visit (INDEPENDENT_AMBULATORY_CARE_PROVIDER_SITE_OTHER): Payer: 59 | Admitting: Internal Medicine

## 2020-08-03 VITALS — BP 130/80 | HR 81 | Temp 99.0°F | Ht 63.0 in | Wt 263.0 lb

## 2020-08-03 DIAGNOSIS — E039 Hypothyroidism, unspecified: Secondary | ICD-10-CM | POA: Diagnosis not present

## 2020-08-03 DIAGNOSIS — E559 Vitamin D deficiency, unspecified: Secondary | ICD-10-CM | POA: Insufficient documentation

## 2020-08-03 DIAGNOSIS — Z0000 Encounter for general adult medical examination without abnormal findings: Secondary | ICD-10-CM | POA: Diagnosis not present

## 2020-08-03 DIAGNOSIS — E1165 Type 2 diabetes mellitus with hyperglycemia: Secondary | ICD-10-CM

## 2020-08-03 MED ORDER — IRBESARTAN 150 MG PO TABS
150.0000 mg | ORAL_TABLET | Freq: Every day | ORAL | 3 refills | Status: DC
Start: 2020-08-03 — End: 2021-08-09

## 2020-08-03 MED ORDER — AMLODIPINE BESYLATE 5 MG PO TABS
5.0000 mg | ORAL_TABLET | Freq: Every day | ORAL | 3 refills | Status: DC
Start: 2020-08-03 — End: 2021-08-09

## 2020-08-03 MED ORDER — METFORMIN HCL ER 500 MG PO TB24
1000.0000 mg | ORAL_TABLET | Freq: Every day | ORAL | 3 refills | Status: DC
Start: 2020-08-03 — End: 2021-04-22

## 2020-08-03 MED ORDER — LEVOTHYROXINE SODIUM 88 MCG PO TABS: 88.0000 ug | ORAL_TABLET | Freq: Every day | ORAL | 3 refills | Status: DC

## 2020-08-03 NOTE — Progress Notes (Signed)
Subjective:    Patient ID: Michelle Davis, female    DOB: 1975/12/17, 44 y.o.   MRN: [id number]  HPI  Here for wellness and f/u;  Overall doing ok;  Pt denies Chest pain, worsening SOB, DOE, wheezing, orthopnea, PND, worsening LE edema, palpitations, dizziness or syncope.  Pt denies neurological change such as new headache, facial or extremity weakness.  Pt denies polydipsia, polyuria, or low sugar symptoms. Pt states overall good compliance with treatment and medications, good tolerability, and has been trying to follow appropriate diet.  Pt denies worsening depressive symptoms, suicidal ideation or panic. No fever, night sweats, wt loss, loss of appetite, or other constitutional symptoms.  Pt states good ability with ADL's, has low fall risk, home safety reviewed and adequate, no other significant changes in hearing or vision, and only occasionally active with exercise. S/p covid infection 05/28/2020 after mom died with covid. Denies hyper or hypo thyroid symptoms such as voice, skin or hair change. Past Medical History:  Diagnosis Date   Hypertension    Hypothyroidism 03/05/2016   Past Surgical History:  Procedure Laterality Date   ABDOMINAL HYSTERECTOMY  06/02/2017   (total)   TUBAL LIGATION  02/2000    reports that she has never smoked. She has never used smokeless tobacco. She reports that she does not drink alcohol and does not use drugs. family history includes Hypertension in an other family member. No Known Allergies Current Outpatient Medications on File Prior to Visit  Medication Sig Dispense Refill   aspirin 81 MG EC tablet Take 1 tablet (81 mg total) by mouth daily. Swallow whole. 30 tablet 12   estrogens, conjugated, (PREMARIN) 0.625 MG tablet Take 0.625 mg by mouth daily. Take daily for 21 days then do not take for 7 days.     rosuvastatin (CRESTOR) 20 MG tablet Take 1 tablet (20 mg total) by mouth daily. 90 tablet 3   No current facility-administered medications on  file prior to visit.   Review of Systems All otherwise neg per pt    Objective:   Physical Exam BP 130/80 (BP Location: Left Arm, Patient Position: Sitting, Cuff Size: Large)    Pulse 81    Temp 99 F (37.2 C) (Oral)    Ht 5\' 3"  (1.6 m)    Wt 263 lb (119.3 kg)    SpO2 97%    BMI 46.59 kg/m  VS noted,  Constitutional: Pt appears in NAD HENT: Head: NCAT.  Right Ear: External ear normal.  Left Ear: External ear normal.  Eyes: . Pupils are equal, round, and reactive to light. Conjunctivae and EOM are normal Nose: without d/c or deformity Neck: Neck supple. Gross normal ROM Cardiovascular: Normal rate and regular rhythm.   Pulmonary/Chest: Effort normal and breath sounds without rales or wheezing.  Abd:  Soft, NT, ND, + BS, no organomegaly Neurological: Pt is alert. At baseline orientation, motor grossly intact Skin: Skin is warm. No rashes, other new lesions, no LE edema Psychiatric: Pt behavior is normal without agitation  All otherwise neg per pt Lab Results  Component Value Date   WBC 6.9 08/01/2020   HGB 14.3 08/01/2020   HCT 42.9 08/01/2020   PLT 193.0 08/01/2020   GLUCOSE 146 (H) 08/01/2020   CHOL 154 08/01/2020   TRIG 93.0 08/01/2020   HDL 55.00 08/01/2020   LDLDIRECT 172.9 06/30/2014   LDLCALC 80 08/01/2020   ALT 27 08/01/2020   AST 28 08/01/2020   NA 139 08/01/2020   K  4.0 08/01/2020   CL 104 08/01/2020   CREATININE 0.83 08/01/2020   BUN 15 08/01/2020   CO2 25 08/01/2020   TSH 5.68 (H) 08/01/2020   HGBA1C 6.6 (H) 08/01/2020   MICROALBUR 0.9 08/01/2020      Assessment & Plan:

## 2020-08-03 NOTE — Patient Instructions (Signed)
Ok to increase the thyroid medication to the 88 mcg per day  Ok to increase the Vit D medication to 2000 units per day  Please continue all other medications as before, and refills have been done if requested.  Please have the pharmacy call with any other refills you may need.  Please continue your efforts at being more active, low cholesterol diet, and weight control.  You are otherwise up to date with prevention measures today.  Please keep your appointments with your specialists as you may have planned  Please make an Appointment to return in 6 months, or sooner if needed, also with Lab Appointment for testing done 3-5 days before at the [place] (so this is for TWO appointments - please see the scheduling desk as you leave)

## 2020-08-05 ENCOUNTER — Encounter: Payer: Self-pay | Admitting: Internal Medicine

## 2020-08-05 NOTE — Assessment & Plan Note (Signed)

## 2020-08-05 NOTE — Assessment & Plan Note (Signed)
For increased levothryoxin to 88 mcg

## 2020-08-05 NOTE — Assessment & Plan Note (Signed)
stable overall by history and exam, recent data reviewed with pt, and pt to continue medical treatment as before,  to f/u any worsening symptoms or concerns  

## 2020-08-05 NOTE — Assessment & Plan Note (Signed)
Cont oral replacement 

## 2020-08-09 ENCOUNTER — Other Ambulatory Visit: Payer: Self-pay | Admitting: Internal Medicine

## 2020-11-16 ENCOUNTER — Telehealth: Payer: Self-pay | Admitting: Internal Medicine

## 2020-11-16 NOTE — Telephone Encounter (Signed)
Followed up with patient in reguards to recent call....patient is driving and will call back

## 2020-11-16 NOTE — Telephone Encounter (Signed)
Patient called and was wondering if we have received any paper work for her new life insurance. She said that Dr. Jenny Reichmann had to sign a piece of paper. She said it says [employer] and [place]. Please call the patient back at [phone].

## 2021-04-16 ENCOUNTER — Other Ambulatory Visit (INDEPENDENT_AMBULATORY_CARE_PROVIDER_SITE_OTHER): Payer: 59

## 2021-04-16 DIAGNOSIS — E039 Hypothyroidism, unspecified: Secondary | ICD-10-CM

## 2021-04-16 DIAGNOSIS — E559 Vitamin D deficiency, unspecified: Secondary | ICD-10-CM

## 2021-04-16 DIAGNOSIS — E1165 Type 2 diabetes mellitus with hyperglycemia: Secondary | ICD-10-CM

## 2021-04-16 LAB — LIPID PANEL
Cholesterol: 142 mg/dL (ref 0–200)
HDL: 58.3 mg/dL (ref >39.00)
LDL Cholesterol: 69 mg/dL (ref 0–99)
NonHDL: 84.15
Total CHOL/HDL Ratio: 2
Triglycerides: 78 mg/dL (ref 0.0–149.0)
VLDL: 15.6 mg/dL (ref 0.0–40.0)

## 2021-04-16 LAB — HEPATIC FUNCTION PANEL
ALT: 23 U/L (ref 0–35)
AST: 19 U/L (ref 0–37)
Albumin: 4.4 g/dL (ref 3.5–5.2)
Alkaline Phosphatase: 46 U/L (ref 39–117)
Bilirubin, Direct: 0.1 mg/dL (ref 0.0–0.3)
Total Bilirubin: 0.4 mg/dL (ref 0.2–1.2)
Total Protein: 7.5 g/dL (ref 6.0–8.3)

## 2021-04-16 LAB — BASIC METABOLIC PANEL
BUN: 19 mg/dL (ref 6–23)
CO2: 27 mEq/L (ref 19–32)
Calcium: 9.8 mg/dL (ref 8.4–10.5)
Chloride: 103 mEq/L (ref 96–112)
Creatinine, Ser: 0.75 mg/dL (ref 0.40–1.20)
GFR: 94.97 mL/min (ref >60.00)
Glucose, Bld: 106 mg/dL — ABNORMAL HIGH (ref 70–99)
Potassium: 4 mEq/L (ref 3.5–5.1)
Sodium: 140 mEq/L (ref 135–145)

## 2021-04-16 LAB — VITAMIN D 25 HYDROXY (VIT D DEFICIENCY, FRACTURES): VITD: 50.67 ng/mL (ref 30.00–100.00)

## 2021-04-16 LAB — HEMOGLOBIN A1C: Hgb A1c MFr Bld: 6.2 % (ref 4.6–6.5)

## 2021-04-16 LAB — TSH: TSH: 2.57 u[IU]/mL (ref 0.35–4.50)

## 2021-04-19 ENCOUNTER — Other Ambulatory Visit: Payer: Self-pay

## 2021-04-19 ENCOUNTER — Encounter: Payer: Self-pay | Admitting: Internal Medicine

## 2021-04-19 ENCOUNTER — Ambulatory Visit (INDEPENDENT_AMBULATORY_CARE_PROVIDER_SITE_OTHER): Payer: 59 | Admitting: Internal Medicine

## 2021-04-19 DIAGNOSIS — E559 Vitamin D deficiency, unspecified: Secondary | ICD-10-CM | POA: Diagnosis not present

## 2021-04-19 DIAGNOSIS — I10 Essential (primary) hypertension: Secondary | ICD-10-CM

## 2021-04-19 DIAGNOSIS — E039 Hypothyroidism, unspecified: Secondary | ICD-10-CM

## 2021-04-19 DIAGNOSIS — E1165 Type 2 diabetes mellitus with hyperglycemia: Secondary | ICD-10-CM

## 2021-04-19 DIAGNOSIS — E7800 Pure hypercholesterolemia, unspecified: Secondary | ICD-10-CM

## 2021-04-19 NOTE — Patient Instructions (Signed)
Ok to reduce the metformin ER 500 mg to ONE per day  Please take OTC Vitamin D3 at 2000 units per day, indefinitely  Please continue all other medications as before, and refills have been done if requested.  Please have the pharmacy call with any other refills you may need.  Please continue your efforts at being more active, low cholesterol diet, and weight control.  You are otherwise up to date with prevention measures today.  Please keep your appointments with your specialists as you may have planned  Please make an Appointment to return in 6 months, or sooner if needed, also with Lab Appointment for testing done 3-5 days before at the [facility] (so this is for TWO appointments - please see the scheduling desk as you leave)  Due to the ongoing Covid 19 pandemic, our lab now requires an appointment for any labs done at our office.  If you need labs done and do not have an appointment, please call our office ahead of time to schedule before presenting to the lab for your testing.

## 2021-04-19 NOTE — Progress Notes (Signed)
Patient ID: Michelle Davis, female   DOB: [birth year], 45 y.o.   MRN: [id number]        Chief Complaint: follow up HTN, HLD and hyperglycemia, low vit d and hypothyoridism       HPI:  Michelle Davis is a 45 y.o. female here overall doing well, Lost 60 lbs from 1 year ago with much better diet and activity (mostly diet) and quite pleased today; Pt denies chest pain, increased sob or doe, wheezing, orthopnea, PND, increased LE swelling, palpitations, dizziness or syncope.   Pt denies polydipsia, polyuria, or new focal neuro s/s.   Pt denies fever, wt loss, night sweats, loss of appetite, or other constitutional symptoms  Denies hyper or hypo thyroid symptoms such as voice, skin or hair change.  Now taking Vit D 200 u   No other new complaints  Wt Readings from Last 3 Encounters:  04/19/21 217 lb (98.4 kg)  08/03/20 263 lb (119.3 kg)  02/02/20 278 lb 12.8 oz (126.5 kg)   BP Readings from Last 3 Encounters:  04/19/21 124/76  08/03/20 130/80  02/02/20 (!) 152/80         Past Medical History:  Diagnosis Date   Hypertension    Hypothyroidism 03/05/2016   Past Surgical History:  Procedure Laterality Date   ABDOMINAL HYSTERECTOMY  06/02/2017   (total)   TUBAL LIGATION  02/2000    reports that she has never smoked. She has never used smokeless tobacco. She reports that she does not drink alcohol and does not use drugs. family history includes Hypertension in an other family member. No Known Allergies Current Outpatient Medications on File Prior to Visit  Medication Sig Dispense Refill   amLODipine (NORVASC) 5 MG tablet Take 1 tablet (5 mg total) by mouth daily. 90 tablet 3   aspirin 81 MG EC tablet Take 1 tablet (81 mg total) by mouth daily. Swallow whole. 30 tablet 12   estrogens, conjugated, (PREMARIN) 0.625 MG tablet Take 0.625 mg by mouth daily. Take daily for 21 days then do not take for 7 days.     irbesartan (AVAPRO) 150 MG tablet Take 1 tablet (150 mg total) by mouth daily. 90  tablet 3   levothyroxine (SYNTHROID) 88 MCG tablet Take 1 tablet (88 mcg total) by mouth daily. 90 tablet 3   rosuvastatin (CRESTOR) 20 MG tablet Take 1 tablet (20 mg total) by mouth daily. 90 tablet 3   No current facility-administered medications on file prior to visit.        ROS:  All others reviewed and negative.  Objective        PE:  BP 124/76 (BP Location: Left Arm, Patient Position: Sitting, Cuff Size: Large)   Pulse 85   Temp 98.4 F (36.9 C) (Oral)   Ht 5\' 3"  (1.6 m)   Wt 217 lb (98.4 kg)   SpO2 97%   BMI 38.44 kg/m                 Constitutional: Pt appears in NAD               HENT: Head: NCAT.                Right Ear: External ear normal.                 Left Ear: External ear normal.                Eyes: . Pupils are equal, round,  and reactive to light. Conjunctivae and EOM are normal               Nose: without d/c or deformity               Neck: Neck supple. Gross normal ROM               Cardiovascular: Normal rate and regular rhythm.                 Pulmonary/Chest: Effort normal and breath sounds without rales or wheezing.                Abd:  Soft, NT, ND, + BS, no organomegaly               Neurological: Pt is alert. At baseline orientation, motor grossly intact               Skin: Skin is warm. No rashes, no other new lesions, LE edema - none               Psychiatric: Pt behavior is normal without agitation   Micro: none  Cardiac tracings I have personally interpreted today:  none  Pertinent Radiological findings (summarize): none   Lab Results  Component Value Date   WBC 6.9 08/01/2020   HGB 14.3 08/01/2020   HCT 42.9 08/01/2020   PLT 193.0 08/01/2020   GLUCOSE 106 (H) 04/16/2021   CHOL 142 04/16/2021   TRIG 78.0 04/16/2021   HDL 58.30 04/16/2021   LDLDIRECT 172.9 06/30/2014   LDLCALC 69 04/16/2021   ALT 23 04/16/2021   AST 19 04/16/2021   NA 140 04/16/2021   K 4.0 04/16/2021   CL 103 04/16/2021   CREATININE 0.75 04/16/2021   BUN 19  04/16/2021   CO2 27 04/16/2021   TSH 2.57 04/16/2021   HGBA1C 6.2 04/16/2021   MICROALBUR 0.9 08/01/2020   Assessment/Plan:  Michelle Davis is a 45 y.o. White or Caucasian [1] female with  has a past medical history of Hypertension and Hypothyroidism (03/05/2016).  Vitamin D deficiency Last vitamin D Lab Results  Component Value Date   VD25OH 50.67 04/16/2021   Stable, cont oral replacement   Diabetes ([place]) Lab Results  Component Value Date   HGBA1C 6.2 04/16/2021   pt to continue current medical treatment except now overcontrolled after her wt loss - ok to decrease the metformin ER 500 to 1 qd   Essential hypertension BP Readings from Last 3 Encounters:  04/19/21 124/76  08/03/20 130/80  02/02/20 (!) 152/80   Stable, pt to continue medical treatment avapro 150   Hyperlipidemia Lab Results  Component Value Date   LDLCALC 69 04/16/2021   Stable, pt to continue current statin crestor 20   Hypothyroidism Lab Results  Component Value Date   TSH 2.57 04/16/2021   Stable, pt to continue levothyroxine  Followup: Return in about 6 months (around 10/19/2021).  Cathlean Cower, MD 04/22/2021 12:45 PM [facility]

## 2021-04-22 ENCOUNTER — Encounter: Payer: Self-pay | Admitting: Internal Medicine

## 2021-04-22 MED ORDER — METFORMIN HCL ER 500 MG PO TB24: 500.0000 mg | ORAL_TABLET | Freq: Every day | ORAL | 3 refills | Status: DC

## 2021-04-22 NOTE — Assessment & Plan Note (Signed)
Lab Results  Component Value Date   LDLCALC 69 04/16/2021   Stable, pt to continue current statin crestor 20

## 2021-04-22 NOTE — Assessment & Plan Note (Addendum)
Lab Results  Component Value Date   TSH 2.57 04/16/2021   Stable, pt to continue levothyroxine

## 2021-04-22 NOTE — Addendum Note (Signed)
Addended by: Biagio Borg on: 04/22/2021 12:47 PM   Modules accepted: Orders

## 2021-04-22 NOTE — Assessment & Plan Note (Signed)
Last vitamin D Lab Results  Component Value Date   VD25OH 50.67 04/16/2021   Stable, cont oral replacement

## 2021-04-22 NOTE — Assessment & Plan Note (Signed)
Lab Results  Component Value Date   HGBA1C 6.2 04/16/2021   pt to continue current medical treatment except now overcontrolled after her wt loss - ok to decrease the metformin ER 500 to 1 qd

## 2021-04-22 NOTE — Assessment & Plan Note (Signed)
BP Readings from Last 3 Encounters:  04/19/21 124/76  08/03/20 130/80  02/02/20 (!) 152/80   Stable, pt to continue medical treatment avapro 150

## 2021-05-02 ENCOUNTER — Other Ambulatory Visit: Payer: Self-pay | Admitting: Internal Medicine

## 2021-05-02 NOTE — Telephone Encounter (Signed)
Please refill as per office routine med refill policy (all routine meds refilled for 3 mo or monthly per pt preference up to one year from last visit, then month to month grace period for 3 mo, then further med refills will have to be denied)  

## 2021-08-09 ENCOUNTER — Other Ambulatory Visit: Payer: Self-pay | Admitting: Internal Medicine

## 2021-08-09 NOTE — Telephone Encounter (Signed)
Please refill as per office routine med refill policy (all routine meds to be refilled for 3 mo or monthly (per pt preference) up to one year from last visit, then month to month grace period for 3 mo, then further med refills will have to be denied) ? ?

## 2022-01-22 ENCOUNTER — Other Ambulatory Visit (INDEPENDENT_AMBULATORY_CARE_PROVIDER_SITE_OTHER): Payer: 59

## 2022-01-22 DIAGNOSIS — E1165 Type 2 diabetes mellitus with hyperglycemia: Secondary | ICD-10-CM | POA: Diagnosis not present

## 2022-01-22 DIAGNOSIS — E559 Vitamin D deficiency, unspecified: Secondary | ICD-10-CM | POA: Diagnosis not present

## 2022-01-22 LAB — BASIC METABOLIC PANEL
BUN: 15 mg/dL (ref 6–23)
CO2: 28 mEq/L (ref 19–32)
Calcium: 9.6 mg/dL (ref 8.4–10.5)
Chloride: 102 mEq/L (ref 96–112)
Creatinine, Ser: 0.82 mg/dL (ref 0.40–1.20)
GFR: 84.87 mL/min (ref >60.00)
Glucose, Bld: 107 mg/dL — ABNORMAL HIGH (ref 70–99)
Potassium: 4.2 mEq/L (ref 3.5–5.1)
Sodium: 139 mEq/L (ref 135–145)

## 2022-01-22 LAB — CBC WITH DIFFERENTIAL/PLATELET
Basophils Absolute: 0.1 10*3/uL (ref 0.0–0.1)
Basophils Relative: 0.7 % (ref 0.0–3.0)
Eosinophils Absolute: 0.3 10*3/uL (ref 0.0–0.7)
Eosinophils Relative: 4.3 % (ref 0.0–5.0)
HCT: 42.6 % (ref 36.0–46.0)
Hemoglobin: 14.6 g/dL (ref 12.0–15.0)
Lymphocytes Relative: 42.4 % (ref 12.0–46.0)
Lymphs Abs: 3.2 10*3/uL (ref 0.7–4.0)
MCHC: 34.3 g/dL (ref 30.0–36.0)
MCV: 88.7 fl (ref 78.0–100.0)
Monocytes Absolute: 0.7 10*3/uL (ref 0.1–1.0)
Monocytes Relative: 9.5 % (ref 3.0–12.0)
Neutro Abs: 3.3 10*3/uL (ref 1.4–7.7)
Neutrophils Relative %: 43.1 % (ref 43.0–77.0)
Platelets: 225 10*3/uL (ref 150.0–400.0)
RBC: 4.8 Mil/uL (ref 3.87–5.11)
RDW: 12.4 % (ref 11.5–15.5)
WBC: 7.7 10*3/uL (ref 4.0–10.5)

## 2022-01-22 LAB — URINALYSIS, ROUTINE W REFLEX MICROSCOPIC
Bilirubin Urine: NEGATIVE
Hgb urine dipstick: NEGATIVE
Ketones, ur: NEGATIVE
Leukocytes,Ua: NEGATIVE
Nitrite: NEGATIVE
RBC / HPF: NONE SEEN (ref 0–?)
Specific Gravity, Urine: 1.025 (ref 1.000–1.030)
Total Protein, Urine: NEGATIVE
Urine Glucose: NEGATIVE
Urobilinogen, UA: 0.2 (ref 0.0–1.0)
pH: 6 (ref 5.0–8.0)

## 2022-01-22 LAB — HEPATIC FUNCTION PANEL
ALT: 12 U/L (ref 0–35)
AST: 14 U/L (ref 0–37)
Albumin: 4.3 g/dL (ref 3.5–5.2)
Alkaline Phosphatase: 45 U/L (ref 39–117)
Bilirubin, Direct: 0.1 mg/dL (ref 0.0–0.3)
Total Bilirubin: 0.3 mg/dL (ref 0.2–1.2)
Total Protein: 7.2 g/dL (ref 6.0–8.3)

## 2022-01-22 LAB — MICROALBUMIN / CREATININE URINE RATIO
Creatinine,U: 138.8 mg/dL
Microalb Creat Ratio: 0.5 mg/g (ref 0.0–30.0)
Microalb, Ur: <0.7 mg/dL (ref 0.0–1.9)

## 2022-01-22 LAB — LIPID PANEL
Cholesterol: 150 mg/dL (ref 0–200)
HDL: 57.8 mg/dL (ref >39.00)
LDL Cholesterol: 76 mg/dL (ref 0–99)
NonHDL: 91.76
Total CHOL/HDL Ratio: 3
Triglycerides: 78 mg/dL (ref 0.0–149.0)
VLDL: 15.6 mg/dL (ref 0.0–40.0)

## 2022-01-22 LAB — VITAMIN D 25 HYDROXY (VIT D DEFICIENCY, FRACTURES): VITD: 38.3 ng/mL (ref 30.00–100.00)

## 2022-01-22 LAB — TSH: TSH: 3.52 u[IU]/mL (ref 0.35–5.50)

## 2022-01-22 LAB — HEMOGLOBIN A1C: Hgb A1c MFr Bld: 6.1 % (ref 4.6–6.5)

## 2022-01-25 ENCOUNTER — Encounter: Payer: Self-pay | Admitting: Internal Medicine

## 2022-01-25 ENCOUNTER — Ambulatory Visit (INDEPENDENT_AMBULATORY_CARE_PROVIDER_SITE_OTHER): Payer: 59 | Admitting: Internal Medicine

## 2022-01-25 VITALS — BP 124/76 | HR 66 | Temp 98.4°F | Ht 63.0 in | Wt 219.0 lb

## 2022-01-25 DIAGNOSIS — M533 Sacrococcygeal disorders, not elsewhere classified: Secondary | ICD-10-CM

## 2022-01-25 DIAGNOSIS — E559 Vitamin D deficiency, unspecified: Secondary | ICD-10-CM

## 2022-01-25 DIAGNOSIS — I10 Essential (primary) hypertension: Secondary | ICD-10-CM

## 2022-01-25 DIAGNOSIS — Z0001 Encounter for general adult medical examination with abnormal findings: Secondary | ICD-10-CM

## 2022-01-25 DIAGNOSIS — Z1211 Encounter for screening for malignant neoplasm of colon: Secondary | ICD-10-CM

## 2022-01-25 DIAGNOSIS — Z1159 Encounter for screening for other viral diseases: Secondary | ICD-10-CM

## 2022-01-25 DIAGNOSIS — E1165 Type 2 diabetes mellitus with hyperglycemia: Secondary | ICD-10-CM

## 2022-01-25 DIAGNOSIS — E7800 Pure hypercholesterolemia, unspecified: Secondary | ICD-10-CM

## 2022-01-25 MED ORDER — OZEMPIC (0.25 OR 0.5 MG/DOSE) 2 MG/1.5ML ~~LOC~~ SOPN: 0.5000 mg | PEN_INJECTOR | SUBCUTANEOUS | 3 refills | Status: DC

## 2022-01-25 NOTE — Patient Instructions (Addendum)
You will be contacted regarding the referral for: colonoscopy ? ?Please take all new medication as prescribed - the ozempic (or the rybelsus if covered) ? ?Ok to stop the metformin ONLY if you start the ozempic or rybelsus ? ?Ok to increase the Vitamin D to 4000 units per day ? ?Please continue all other medications as before, and refills have been done if requested. ? ?Please have the pharmacy call with any other refills you may need. ? ?Please continue your efforts at being more active, low cholesterol diet, and weight control. ? ?You are otherwise up to date with prevention measures today. ? ?Please keep your appointments with your specialists as you may have planned ? ?Please see Sports Medicine on the first floor regarding the lower back/sacral pain ? ?Please make an Appointment to return in 6 months, or sooner if needed, also with Lab Appointment for testing done 3-5 days before at the [facility] (so this is for TWO appointments - please see the scheduling desk as you leave) ? ?Due to the ongoing Covid 19 pandemic, our lab now requires an appointment for any labs done at our office.  If you need labs done and do not have an appointment, please call our office ahead of time to schedule before presenting to the lab for your testing. ? ? ? ?

## 2022-01-25 NOTE — Assessment & Plan Note (Addendum)
Last vitamin D ?Lab Results  ?Component Value Date  ? VD25OH 38.30 01/22/2022  ? ?Low, to increase oral replacement to 4000 u ? ?

## 2022-01-25 NOTE — Progress Notes (Addendum)
Patient ID: Michelle Davis, female   DOB: October 24, 1976, 46 y.o.   MRN: [id number] ? ? ? ?     Chief Complaint:: wellness exam and low vit d, dm, hld, sacral pain  ? ?     HPI:  Michelle Davis is a 46 y.o. female here for wellness exam; due for hep c screen, eye exam (plans to call herself) and colonoscopy, ow up to date ?         ?              Also taking vit d 2000 u but level still low.  Pt denies chest pain, increased sob or doe, wheezing, orthopnea, PND, increased LE swelling, palpitations, dizziness or syncope.   Pt denies polydipsia, polyuria, or new focal neuro s/s.   Cant lose wt with metformin only, asks for change to ozempic  Trying to follow lower chol diet.  Also has low sacral pain aching mild to mod x 2-3 months, worse at both sides, worse to stand and walk, better to lie down, nothing else makes better or worse.  ? ?Wt Readings from Last 3 Encounters:  ?01/25/22 219 lb (99.3 kg)  ?04/19/21 217 lb (98.4 kg)  ?08/03/20 263 lb (119.3 kg)  ? ?BP Readings from Last 3 Encounters:  ?01/25/22 124/76  ?04/19/21 124/76  ?08/03/20 130/80  ? ?Immunization History  ?Administered Date(s) Administered  ? PFIZER(Purple Top)SARS-COV-2 Vaccination 12/02/2020  ? ?Health Maintenance Due  ?Topic Date Due  ? Hepatitis C Screening  Never done  ? COLONOSCOPY (Pts 45-62yr Insurance coverage will need to be confirmed)  Never done  ? OPHTHALMOLOGY EXAM  08/29/2020  ? ?  ? ?Past Medical History:  ?Diagnosis Date  ? Hypertension   ? Hypothyroidism 03/05/2016  ? ?Past Surgical History:  ?Procedure Laterality Date  ? ABDOMINAL HYSTERECTOMY  06/02/2017  ? (total)  ? TUBAL LIGATION  02/2000  ? ? reports that she has never smoked. She has never used smokeless tobacco. She reports that she does not drink alcohol and does not use drugs. ?family history includes Hypertension in an other family member. ?No Known Allergies ?Current Outpatient Medications on File Prior to Visit  ?Medication Sig Dispense Refill  ? amLODipine (NORVASC) 5 MG  tablet TAKE 1 TABLET BY MOUTH DAILY 90 tablet 2  ? aspirin 81 MG EC tablet Take 1 tablet (81 mg total) by mouth daily. Swallow whole. 30 tablet 12  ? estrogens, conjugated, (PREMARIN) 0.625 MG tablet Take 0.625 mg by mouth daily. Take daily for 21 days then do not take for 7 days.    ? irbesartan (AVAPRO) 150 MG tablet TAKE 1 TABLET BY MOUTH DAILY 90 tablet 2  ? levothyroxine (SYNTHROID) 88 MCG tablet TAKE 1 TABLET BY MOUTH DAILY 90 tablet 2  ? metFORMIN (GLUCOPHAGE-XR) 500 MG 24 hr tablet TAKE 2 TABLETS BY MOUTH DAILY WITH BREAKFAST (Patient taking differently: Take 500 mg by mouth daily with breakfast.) 180 tablet 2  ? rosuvastatin (CRESTOR) 20 MG tablet TAKE ONE TABLET BY MOUTH DAILY 90 tablet 3  ? ?No current facility-administered medications on file prior to visit.  ? ?     ROS:  All others reviewed and negative. ? ?Objective  ? ?     PE:  BP 124/76 (BP Location: Right Arm, Patient Position: Sitting, Cuff Size: Large)   Pulse 66   Temp 98.4 ?F (36.9 ?C) (Oral)   Ht '5\' 3"'$  (1.6 m)   Wt 219 lb (99.3 kg)  SpO2 98%   BMI 38.79 kg/m?  ? ?              Constitutional: Pt appears in NAD ?              HENT: Head: NCAT.  ?              Right Ear: External ear normal.   ?              Left Ear: External ear normal.  ?              Eyes: . Pupils are equal, round, and reactive to light. Conjunctivae and EOM are normal ?              Nose: without d/c or deformity ?              Neck: Neck supple. Gross normal ROM ?              Cardiovascular: Normal rate and regular rhythm.   ?              Pulmonary/Chest: Effort normal and breath sounds without rales or wheezing.  ?              Abd:  Soft, NT, ND, + BS, no organomegaly ?              Neurological: Pt is alert. At baseline orientation, motor grossly intact ?              Skin: Skin is warm. No rashes, no other new lesions, LE edema - none ?              Psychiatric: Pt behavior is normal without agitation  ? ?Micro: none ? ?Cardiac tracings I have personally  interpreted today:  none ? ?Pertinent Radiological findings (summarize): none  ? ?Lab Results  ?Component Value Date  ? WBC 7.7 01/22/2022  ? HGB 14.6 01/22/2022  ? HCT 42.6 01/22/2022  ? PLT 225.0 01/22/2022  ? GLUCOSE 107 (H) 01/22/2022  ? CHOL 150 01/22/2022  ? TRIG 78.0 01/22/2022  ? HDL 57.80 01/22/2022  ? LDLDIRECT 172.9 06/30/2014  ? [place] 76 01/22/2022  ? ALT 12 01/22/2022  ? AST 14 01/22/2022  ? NA 139 01/22/2022  ? K 4.2 01/22/2022  ? CL 102 01/22/2022  ? CREATININE 0.82 01/22/2022  ? BUN 15 01/22/2022  ? CO2 28 01/22/2022  ? TSH 3.52 01/22/2022  ? HGBA1C 6.1 01/22/2022  ? MICROALBUR <0.7 01/22/2022  ? ?Assessment/Plan:  ?Michelle Davis is a 47 y.o. White or Caucasian [1] female with  has a past medical history of Hypertension and Hypothyroidism (03/05/2016). ? ?Vitamin D deficiency ?Last vitamin D ?Lab Results  ?Component Value Date  ? VD25OH 38.30 01/22/2022  ? ?Low, to increase oral replacement to 4000 u ? ? ?Encounter for well adult exam with abnormal findings ?Age and sex appropriate education and counseling updated with regular exercise and diet ?Referrals for preventative services - for hep c screen, colonoscopy, and plans to call for her own eye exam ?Immunizations addressed - none needed ?Smoking counseling  - none needed ?Evidence for depression or other mood disorder - none significant ?Most recent labs reviewed. ?I have personally reviewed and have noted: ?1) the patient's medical and social history ?2) The patient's current medications and supplements ?3) The patient's height, weight, and BMI have been recorded in the chart ? ? ?Hyperlipidemia ?Lab Results  ?Component Value Date  ?  [place] 76 01/22/2022  ? ?Mild uncontrolled, goal ldl < 70, pt to continue current statin crestor 20 as declines change for now, for lower chol diet ? ? ?Essential hypertension ? ?Stable, pt to continue medical treatment norvasc, avapro ? ? ?Diabetes ([place]) ?Lab Results  ?Component Value Date  ? HGBA1C 6.1  01/22/2022  ? ?Stable, pt to continue current medical treatment  - metformin, but change to rybelsus or ozempic if coverred by insurance to help better wt loss ? ? ?Sacral dysfunction ?Ongoing recurrent problem - pt for referral sport med ? ?Followup: Return in about 6 months (around 07/27/2022). ? ?Cathlean Cower, MD 01/26/2022 4:33 PM ?[place] [facility] ?Internal Medicine ?

## 2022-01-26 ENCOUNTER — Encounter: Payer: Self-pay | Admitting: Internal Medicine

## 2022-01-26 NOTE — Assessment & Plan Note (Addendum)
Lab Results  ?Component Value Date  ? HGBA1C 6.1 01/22/2022  ? ?Stable, pt to continue current medical treatment  - metformin, but change to rybelsus or ozempic if coverred by insurance to help better wt loss ? ?

## 2022-01-26 NOTE — Assessment & Plan Note (Signed)
Ongoing recurrent problem - pt for referral sport med ?

## 2022-01-26 NOTE — Addendum Note (Signed)
Addended by: Biagio Borg on: 01/26/2022 04:34 PM ? ? Modules accepted: Orders ? ?

## 2022-01-26 NOTE — Assessment & Plan Note (Signed)
Age and sex appropriate education and counseling updated with regular exercise and diet ?Referrals for preventative services - for hep c screen, colonoscopy, and plans to call for her own eye exam ?Immunizations addressed - none needed ?Smoking counseling  - none needed ?Evidence for depression or other mood disorder - none significant ?Most recent labs reviewed. ?I have personally reviewed and have noted: ?1) the patient's medical and social history ?2) The patient's current medications and supplements ?3) The patient's height, weight, and BMI have been recorded in the chart ? ?

## 2022-01-26 NOTE — Assessment & Plan Note (Signed)
?  Stable, pt to continue medical treatment norvasc, avapro ? ?

## 2022-01-26 NOTE — Assessment & Plan Note (Addendum)
Lab Results  ?Component Value Date  ? [place] 76 01/22/2022  ? ?Mild uncontrolled, goal ldl < 70, pt to continue current statin crestor 20 as declines change for now, for lower chol diet ? ?

## 2022-01-28 ENCOUNTER — Encounter: Payer: Self-pay | Admitting: Internal Medicine

## 2022-01-28 NOTE — Progress Notes (Signed)
? ? Michelle Davis D.Merril Abbe ?[facility] ?[place] ?Phone: [phone] ?  ?Assessment and Plan:   ?  ?1. Coccyx pain ?2. Coccydynia ?-Chronic with exacerbation, initial sports medicine visit ?- Likely chronic appearing changes to distal coccyx that have now had a flare and worsening pain over the past 6+ months due to patient's purposeful weight loss ?- Start meloxicam 15 mg daily x2 weeks.  If still having pain after 2 weeks, complete 3rd-week of meloxicam. May use remaining meloxicam as needed once daily for pain control.  Do not to use additional NSAIDs while taking meloxicam.  May use Tylenol [phone] mg 2 to 3 times a day for breakthrough pain. ?- Start using sacrum/coccyx/tailbone sitting wedge pillow whenever sitting for prolonged periods of time ?-X-ray obtained in clinic.  My interpretation: No acute fracture or dislocation.  Chronic appearing cortical changes at distal coccyx that coincide with patient's area of pain ? ?Pertinent previous records reviewed include PCP note 01/25/2022 ?  ?Follow Up: As needed if no improvement or worsening of symptoms ?  ?Subjective:   ?I, Michelle Davis, am serving as a Education administrator for [employer] ? ?Chief Complaint: tailbone pain  ? ?HPI:  ?01/29/2022 ?Patient is a 46 year old female complaining of tailbone pain. Patient states no MOI, when she gets up  form sitting  she can feel some pain on her tail bone since august, no numbness or tingling in that area, uses 6 pillows when she sleeps to help get comfortable, no radiating pain, has used tylenol and that helps a little but the pain is still there sitting is the worst pain  ? ?Relevant Historical Information: DM type II, hypertension, hypothyroidism, 60 pound purposeful weight loss over the past 1 year ? ?Additional pertinent review of systems negative. ? ? ?Current Outpatient Medications:  ?  amLODipine (NORVASC) 5 MG tablet, TAKE 1 TABLET BY MOUTH DAILY, Disp:  90 tablet, Rfl: 2 ?  aspirin 81 MG EC tablet, Take 1 tablet (81 mg total) by mouth daily. Swallow whole., Disp: 30 tablet, Rfl: 12 ?  estrogens, conjugated, (PREMARIN) 0.625 MG tablet, Take 0.625 mg by mouth daily. Take daily for 21 days then do not take for 7 days., Disp: , Rfl:  ?  irbesartan (AVAPRO) 150 MG tablet, TAKE 1 TABLET BY MOUTH DAILY, Disp: 90 tablet, Rfl: 2 ?  levothyroxine (SYNTHROID) 88 MCG tablet, TAKE 1 TABLET BY MOUTH DAILY, Disp: 90 tablet, Rfl: 2 ?  meloxicam (MOBIC) 15 MG tablet, Take 1 tablet (15 mg total) by mouth daily., Disp: 30 tablet, Rfl: 0 ?  metFORMIN (GLUCOPHAGE-XR) 500 MG 24 hr tablet, TAKE 2 TABLETS BY MOUTH DAILY WITH BREAKFAST (Patient taking differently: Take 500 mg by mouth daily with breakfast.), Disp: 180 tablet, Rfl: 2 ?  rosuvastatin (CRESTOR) 20 MG tablet, TAKE ONE TABLET BY MOUTH DAILY, Disp: 90 tablet, Rfl: 3 ?  Semaglutide,0.25 or 0.'5MG'$ /DOS, (OZEMPIC, 0.25 OR 0.5 MG/DOSE,) 2 MG/1.5ML SOPN, Inject 0.5 mg into the skin once a week., Disp: 6 mL, Rfl: 3  ? ?Objective:   ?  ?Vitals:  ? 01/29/22 1113  ?BP: 130/78  ?Pulse: 80  ?SpO2: 96%  ?Weight: 216 lb (98 kg)  ?Height: '5\' 3"'$  (1.6 m)  ?  ?  ?Body mass index is 38.26 kg/m?.  ?  ?Physical Exam:   ? ?Gen: Appears well, nad, nontoxic and pleasant ?Psych: Alert and oriented, appropriate mood and affect ?Neuro: sensation intact, strength is 5/5 in  upper and lower extremities, muscle tone wnl ?Skin: no susupicious lesions or rashes ? ?Back - Normal skin, Spine with normal alignment and no deformity.   ?No tenderness to vertebral process palpation.   ?Paraspinous muscles are not tender and without spasm ?Straight leg raise negative ?Trendelenberg negative  ?TTP distal coccyx ? ?Electronically signed by:  ?Michelle Davis D.Merril Abbe ?[facility] ?11:41 AM 01/29/22 ?

## 2022-01-29 ENCOUNTER — Ambulatory Visit (INDEPENDENT_AMBULATORY_CARE_PROVIDER_SITE_OTHER): Payer: 59 | Admitting: Sports Medicine

## 2022-01-29 ENCOUNTER — Ambulatory Visit (INDEPENDENT_AMBULATORY_CARE_PROVIDER_SITE_OTHER): Payer: 59

## 2022-01-29 VITALS — BP 130/78 | HR 80 | Ht 63.0 in | Wt 216.0 lb

## 2022-01-29 DIAGNOSIS — M533 Sacrococcygeal disorders, not elsewhere classified: Secondary | ICD-10-CM | POA: Diagnosis not present

## 2022-01-29 MED ORDER — MELOXICAM 15 MG PO TABS: 15.0000 mg | ORAL_TABLET | Freq: Every day | ORAL | 0 refills | Status: DC

## 2022-01-29 NOTE — Patient Instructions (Addendum)
Good to see you  ?Start meloxicam 15 mg daily x2 weeks.  If still having pain after 2 weeks, complete 3rd-week of meloxicam. May use remaining meloxicam as needed once daily for pain control.  Do not to use additional NSAIDs while taking meloxicam.  May use Tylenol [phone] mg 2 to 3 times a day for breakthrough pain. ?Recommend getting a coccyx/ sacrum / tailbone sitting wedge pillow to use anytime sitting for a prolonged period of time  ?As needed follow up  ?

## 2022-01-30 ENCOUNTER — Encounter: Payer: Self-pay | Admitting: Internal Medicine

## 2022-01-30 MED ORDER — RYBELSUS 3 MG PO TABS: 3.0000 mg | ORAL_TABLET | Freq: Every day | ORAL | 3 refills | Status: DC

## 2022-02-25 ENCOUNTER — Other Ambulatory Visit: Payer: Self-pay | Admitting: Sports Medicine

## 2022-04-12 ENCOUNTER — Telehealth: Payer: Self-pay | Admitting: Internal Medicine

## 2022-04-12 NOTE — Telephone Encounter (Signed)
Patient rescheduled for procedure 05/29/22 at 8:am due to her care partner not being available.

## 2022-04-26 ENCOUNTER — Encounter: Payer: 59 | Admitting: Internal Medicine

## 2022-05-12 ENCOUNTER — Other Ambulatory Visit: Payer: Self-pay | Admitting: Sports Medicine

## 2022-05-12 ENCOUNTER — Other Ambulatory Visit: Payer: Self-pay | Admitting: Internal Medicine

## 2022-05-12 NOTE — Telephone Encounter (Signed)
Please refill as per office routine med refill policy (all routine meds to be refilled for 3 mo or monthly (per pt preference) up to one year from last visit, then month to month grace period for 3 mo, then further med refills will have to be denied) ? ?

## 2022-05-13 ENCOUNTER — Ambulatory Visit (AMBULATORY_SURGERY_CENTER): Payer: 59 | Admitting: *Deleted

## 2022-05-13 VITALS — Ht 63.0 in | Wt 217.0 lb

## 2022-05-13 DIAGNOSIS — Z1211 Encounter for screening for malignant neoplasm of colon: Secondary | ICD-10-CM

## 2022-05-13 NOTE — Progress Notes (Signed)
Patient's pre-visit was done today over the phone with the patient. Name,DOB and address verified. Patient denies any allergies to Eggs and Soy. Patient denies any problems with anesthesia/sedation. Patient is not taking any diet pills or blood thinners. No home Oxygen. Insurance confirmed with patient.  Went over prep instructions with patient. Prep instructions sent to pt's MyChart & mailed to pt-pt is aware. Patient understands to call us back with any questions or concerns. Patient is aware of our care-partner policy.

## 2022-05-28 ENCOUNTER — Telehealth: Payer: Self-pay

## 2022-05-28 MED ORDER — ROSUVASTATIN CALCIUM 20 MG PO TABS: 20.0000 mg | ORAL_TABLET | Freq: Every day | ORAL | 2 refills | Status: DC

## 2022-05-28 NOTE — Telephone Encounter (Signed)
Pt is requesting a refill for: rosuvastatin (CRESTOR) 20 MG tablet  Pharmacy: [id number] [id number] 08/01/22

## 2022-05-28 NOTE — Telephone Encounter (Signed)
Pt is up-to--date [facility]

## 2022-05-29 ENCOUNTER — Encounter: Payer: 59 | Admitting: Internal Medicine

## 2022-06-19 ENCOUNTER — Ambulatory Visit (INDEPENDENT_AMBULATORY_CARE_PROVIDER_SITE_OTHER): Payer: 59 | Admitting: Internal Medicine

## 2022-06-19 VITALS — BP 124/68 | HR 76 | Temp 98.5°F | Ht 63.0 in | Wt 221.0 lb

## 2022-06-19 DIAGNOSIS — E538 Deficiency of other specified B group vitamins: Secondary | ICD-10-CM

## 2022-06-19 DIAGNOSIS — E1165 Type 2 diabetes mellitus with hyperglycemia: Secondary | ICD-10-CM

## 2022-06-19 DIAGNOSIS — E559 Vitamin D deficiency, unspecified: Secondary | ICD-10-CM

## 2022-06-19 DIAGNOSIS — M545 Low back pain, unspecified: Secondary | ICD-10-CM | POA: Diagnosis not present

## 2022-06-19 DIAGNOSIS — Z1159 Encounter for screening for other viral diseases: Secondary | ICD-10-CM

## 2022-06-19 DIAGNOSIS — I10 Essential (primary) hypertension: Secondary | ICD-10-CM | POA: Diagnosis not present

## 2022-06-19 LAB — LIPID PANEL
Cholesterol: 148 mg/dL (ref 0–200)
HDL: 61.2 mg/dL (ref >39.00)
LDL Cholesterol: 71 mg/dL (ref 0–99)
NonHDL: 86.97
Total CHOL/HDL Ratio: 2
Triglycerides: 80 mg/dL (ref 0.0–149.0)
VLDL: 16 mg/dL (ref 0.0–40.0)

## 2022-06-19 LAB — BASIC METABOLIC PANEL
BUN: 15 mg/dL (ref 6–23)
CO2: 26 mEq/L (ref 19–32)
Calcium: 9.4 mg/dL (ref 8.4–10.5)
Chloride: 102 mEq/L (ref 96–112)
Creatinine, Ser: 0.69 mg/dL (ref 0.40–1.20)
GFR: 102.68 mL/min (ref >60.00)
Glucose, Bld: 90 mg/dL (ref 70–99)
Potassium: 4.2 mEq/L (ref 3.5–5.1)
Sodium: 140 mEq/L (ref 135–145)

## 2022-06-19 LAB — VITAMIN D 25 HYDROXY (VIT D DEFICIENCY, FRACTURES): VITD: 48.48 ng/mL (ref 30.00–100.00)

## 2022-06-19 LAB — HEPATIC FUNCTION PANEL
ALT: 12 U/L (ref 0–35)
AST: 15 U/L (ref 0–37)
Albumin: 4.3 g/dL (ref 3.5–5.2)
Alkaline Phosphatase: 48 U/L (ref 39–117)
Bilirubin, Direct: 0.1 mg/dL (ref 0.0–0.3)
Total Bilirubin: 0.4 mg/dL (ref 0.2–1.2)
Total Protein: 7.4 g/dL (ref 6.0–8.3)

## 2022-06-19 LAB — HEMOGLOBIN A1C: Hgb A1c MFr Bld: 6 % (ref 4.6–6.5)

## 2022-06-19 MED ORDER — TRAMADOL HCL 50 MG PO TABS: 50.0000 mg | ORAL_TABLET | Freq: Four times a day (QID) | ORAL | 0 refills | Status: DC | PRN

## 2022-06-19 MED ORDER — CYCLOBENZAPRINE HCL 5 MG PO TABS: 5.0000 mg | ORAL_TABLET | Freq: Three times a day (TID) | ORAL | 1 refills | Status: AC | PRN

## 2022-06-19 NOTE — Progress Notes (Addendum)
Patient ID: Michelle Davis, female   DOB: 08/07/1976, 46 y.o.   MRN: [id number]        Chief Complaint: follow up left low back pain, dm, htn, low vit d       HPI:  Michelle Davis is a 46 y.o. female here with c/o 1 wk onset left lower back pain with tender spasm it seems, radiates up towards the left flank area but no Le symptoms.  Worse to stand or bend.  Can radiate at times to the left lateral abd but no rash.  Overall moderate, constant, sharp.  Nothing else makes better or worse.  Did have tailbone injury mar 2023 but this is different.  Nsaid from that episode not working.  Now taking Vit d.  Pt denies chest pain, increased sob or doe, wheezing, orthopnea, PND, increased LE swelling, palpitations, dizziness or syncope.   Pt denies polydipsia, polyuria, or new focal neuro s/s.    Pt denies fever, wt loss, night sweats, loss of appetite, or other constitutional symptoms  Denies other GI or GU symptoms       Wt Readings from Last 3 Encounters:  06/19/22 221 lb (100.2 kg)  05/13/22 217 lb (98.4 kg)  01/29/22 216 lb (98 kg)   BP Readings from Last 3 Encounters:  06/19/22 124/68  01/29/22 130/78  01/25/22 124/76         Past Medical History:  Diagnosis Date   Diabetes mellitus without complication ([place])    Hyperlipidemia    Hypertension    Hypothyroidism 03/05/2016   Past Surgical History:  Procedure Laterality Date   ABDOMINAL HYSTERECTOMY  06/02/2017   (total)   TUBAL LIGATION  02/2000   WISDOM TOOTH EXTRACTION      reports that she has never smoked. She has never used smokeless tobacco. She reports that she does not drink alcohol and does not use drugs. family history includes Colon polyps in her father; Hypertension in an other family member. No Known Allergies Current Outpatient Medications on File Prior to Visit  Medication Sig Dispense Refill   acetaminophen (TYLENOL) 500 MG tablet Take 500 mg by mouth every 6 (six) hours as needed.     amLODipine (NORVASC) 5 MG  tablet TAKE 1 TABLET BY MOUTH EVERY DAY 90 tablet 2   aspirin 81 MG EC tablet Take 1 tablet (81 mg total) by mouth daily. Swallow whole. 30 tablet 12   cetirizine (ZYRTEC) 10 MG tablet Take 10 mg by mouth daily.     estradiol (ESTRACE) 1 MG tablet Take 1 mg by mouth daily.     irbesartan (AVAPRO) 150 MG tablet TAKE 1 TABLET BY MOUTH DAILY 90 tablet 2   levothyroxine (SYNTHROID) 88 MCG tablet TAKE 1 TABLET BY MOUTH EVERY DAY 90 tablet 2   rosuvastatin (CRESTOR) 20 MG tablet Take 1 tablet (20 mg total) by mouth daily. 90 tablet 2   Semaglutide (RYBELSUS) 3 MG TABS Take 3 mg by mouth daily. 90 tablet 3   VITAMIN D PO Take 2 tablets by mouth daily.     No current facility-administered medications on file prior to visit.        ROS:  All others reviewed and negative.  Objective        PE:  BP 124/68 (BP Location: Left Arm, Patient Position: Sitting, Cuff Size: Large)   Pulse 76   Temp 98.5 F (36.9 C) (Oral)   Ht '5\' 3"'$  (1.6 m)   Wt 221 lb (100.2 kg)  LMP 01/07/2012   SpO2 98%   BMI 39.15 kg/m                 Constitutional: Pt appears in NAD               HENT: Head: NCAT.                Right Ear: External ear normal.                 Left Ear: External ear normal.                Eyes: . Pupils are equal, round, and reactive to light. Conjunctivae and EOM are normal               Nose: without d/c or deformity               Neck: Neck supple. Gross normal ROM               Cardiovascular: Normal rate and regular rhythm.                 Pulmonary/Chest: Effort normal and breath sounds without rales or wheezing.                Abd:  Soft, NT, ND, + BS, no organomegaly               Neurological: Pt is alert. At baseline orientation, motor grossly intact               Skin: Skin is warm. No rashes, no other new lesions, LE edema - none, but has left >> right bilateral tender msk spasm               Psychiatric: Pt behavior is normal without agitation   Micro: none  Cardiac  tracings I have personally interpreted today:  none  Pertinent Radiological findings (summarize): none   Lab Results  Component Value Date   WBC 7.7 01/22/2022   HGB 14.6 01/22/2022   HCT 42.6 01/22/2022   PLT 225.0 01/22/2022   GLUCOSE 90 06/19/2022   CHOL 148 06/19/2022   TRIG 80.0 06/19/2022   HDL 61.20 06/19/2022   LDLDIRECT 172.9 06/30/2014   LDLCALC 71 06/19/2022   ALT 12 06/19/2022   AST 15 06/19/2022   NA 140 06/19/2022   K 4.2 06/19/2022   CL 102 06/19/2022   CREATININE 0.69 06/19/2022   BUN 15 06/19/2022   CO2 26 06/19/2022   TSH 3.52 01/22/2022   HGBA1C 6.0 06/19/2022   MICROALBUR <0.7 01/22/2022   Assessment/Plan:  Michelle Davis is a 46 y.o. White or Caucasian [1] female with  has a past medical history of Diabetes mellitus without complication ([place]), Hyperlipidemia, Hypertension, and Hypothyroidism (03/05/2016).  Diabetes ([place]) Lab Results  Component Value Date   HGBA1C 6.0 06/19/2022   Stable, pt to continue current medical treatment rybelsus 3 mg qd   Essential hypertension BP Readings from Last 3 Encounters:  06/19/22 124/68  01/29/22 130/78  01/25/22 124/76   Stable, pt to continue medical treatment norvasc 5 mg, avapro 150 mg qd   Vitamin D deficiency Last vitamin D Lab Results  Component Value Date   VD25OH 48.48 06/19/2022   Stable, cont oral replacement   Low back pain Exam c/w msk strain with spasm - for tramadol prn, flexeril prn,  to f/u any worsening symptoms or concerns  Followup: Return in about 6 months (around 12/20/2022).  Cathlean Cower, MD 06/23/2022 1:15 PM [facility]

## 2022-06-19 NOTE — Addendum Note (Signed)
Addended by: Boris Lown B on: 06/19/2022 09:44 AM   Modules accepted: Orders

## 2022-06-19 NOTE — Patient Instructions (Addendum)
Please take all new medication as prescribed - the tramadol for pain, and muscle relaxer as needed  Please continue all other medications as before, and refills have been done if requested.  Please have the pharmacy call with any other refills you may need.  Please continue your efforts at being more active, low cholesterol diet, and weight control  Please keep your appointments with your specialists as you may have planned  [facility] to CANCEL the oct 2023 appt since you are here today  Please go to the LAB at the blood drawing area for the tests to be done  You will be contacted by phone if any changes need to be made immediately.  Otherwise, you will receive a letter about your results with an explanation, but please check with MyChart first.  Please remember to sign up for MyChart if you have not done so, as this will be important to you in the future with finding out test results, communicating by private email, and scheduling acute appointments online when needed.  Please make an Appointment to return in 6 months, or sooner if needed, also with Lab Appointment for testing done 3-5 days before at the [facility] (so this is for TWO appointments - please see the scheduling desk as you leave)

## 2022-06-20 LAB — HEPATITIS C ANTIBODY: Hepatitis C Ab: NONREACTIVE

## 2022-06-23 ENCOUNTER — Encounter: Payer: Self-pay | Admitting: Internal Medicine

## 2022-06-23 NOTE — Assessment & Plan Note (Signed)
Exam c/w msk strain with spasm - for tramadol prn, flexeril prn,  to f/u any worsening symptoms or concerns

## 2022-06-23 NOTE — Assessment & Plan Note (Signed)
BP Readings from Last 3 Encounters:  06/19/22 124/68  01/29/22 130/78  01/25/22 124/76   Stable, pt to continue medical treatment norvasc 5 mg, avapro 150 mg qd

## 2022-06-23 NOTE — Assessment & Plan Note (Signed)
Last vitamin D Lab Results  Component Value Date   VD25OH 48.48 06/19/2022   Stable, cont oral replacement  

## 2022-06-23 NOTE — Assessment & Plan Note (Signed)
Lab Results  Component Value Date   HGBA1C 6.0 06/19/2022   Stable, pt to continue current medical treatment rybelsus 3 mg qd

## 2022-06-28 ENCOUNTER — Encounter: Payer: Self-pay | Admitting: Internal Medicine

## 2022-07-07 ENCOUNTER — Encounter: Payer: Self-pay | Admitting: Certified Registered Nurse Anesthetist

## 2022-07-14 NOTE — Progress Notes (Unsigned)
[facility] History and Physical   Primary Care Physician:  Biagio Borg, MD   Reason for Procedure:   Colon cancer screening  Plan:    colonoscopy     HPI: Michelle Davis is a 46 y.o. female presenting for colon cancer screening exam.   Past Medical History:  Diagnosis Date   Diabetes mellitus without complication ([place])    Hyperlipidemia    Hypertension    Hypothyroidism 03/05/2016    Past Surgical History:  Procedure Laterality Date   ABDOMINAL HYSTERECTOMY  06/02/2017   (total)   TUBAL LIGATION  02/2000   WISDOM TOOTH EXTRACTION      Prior to Admission medications   Medication Sig Start Date End Date Taking? Authorizing Provider  acetaminophen (TYLENOL) 500 MG tablet Take 500 mg by mouth every 6 (six) hours as needed.    [provider]  amLODipine (NORVASC) 5 MG tablet TAKE 1 TABLET BY MOUTH EVERY DAY 05/13/22   Biagio Borg, MD  aspirin 81 MG EC tablet Take 1 tablet (81 mg total) by mouth daily. Swallow whole. 06/24/17   Biagio Borg, MD  cetirizine (ZYRTEC) 10 MG tablet Take 10 mg by mouth daily.    [provider]  cyclobenzaprine (FLEXERIL) 5 MG tablet Take 1 tablet (5 mg total) by mouth 3 (three) times daily as needed for muscle spasms. 06/19/22   Biagio Borg, MD  estradiol (ESTRACE) 1 MG tablet Take 1 mg by mouth daily. 03/28/22   [provider]  irbesartan (AVAPRO) 150 MG tablet TAKE 1 TABLET BY MOUTH DAILY 08/09/21   Biagio Borg, MD  levothyroxine (SYNTHROID) 88 MCG tablet TAKE 1 TABLET BY MOUTH EVERY DAY 05/13/22   Biagio Borg, MD  rosuvastatin (CRESTOR) 20 MG tablet Take 1 tablet (20 mg total) by mouth daily. 05/28/22   Biagio Borg, MD  Semaglutide (RYBELSUS) 3 MG TABS Take 3 mg by mouth daily. 01/30/22   Biagio Borg, MD  traMADol (ULTRAM) 50 MG tablet Take 1 tablet (50 mg total) by mouth every 6 (six) hours as needed. 06/19/22   Biagio Borg, MD  VITAMIN D PO Take 2 tablets by mouth daily.    [provider]    Current Outpatient Medications  Medication Sig Dispense Refill   amLODipine (NORVASC) 5 MG tablet TAKE 1 TABLET BY MOUTH EVERY DAY 90 tablet 2   aspirin 81 MG EC tablet Take 1 tablet (81 mg total) by mouth daily. Swallow whole. 30 tablet 12   cetirizine (ZYRTEC) 10 MG tablet Take 10 mg by mouth daily.     estradiol (ESTRACE) 1 MG tablet Take 1 mg by mouth daily.     irbesartan (AVAPRO) 150 MG tablet TAKE 1 TABLET BY MOUTH DAILY 90 tablet 2   levothyroxine (SYNTHROID) 88 MCG tablet TAKE 1 TABLET BY MOUTH EVERY DAY 90 tablet 2   rosuvastatin (CRESTOR) 20 MG tablet Take 1 tablet (20 mg total) by mouth daily. 90 tablet 2   Semaglutide (RYBELSUS) 3 MG TABS Take 3 mg by mouth daily. 90 tablet 3   VITAMIN D PO Take 2 tablets by mouth daily.     acetaminophen (TYLENOL) 500 MG tablet Take 500 mg by mouth every 6 (six) hours as needed.     cyclobenzaprine (FLEXERIL) 5 MG tablet Take 1 tablet (5 mg total) by mouth 3 (three) times daily as needed for muscle spasms. 40 tablet 1   meloxicam (MOBIC) 15 MG tablet Take  1 tablet by mouth daily.     traMADol (ULTRAM) 50 MG tablet Take 1 tablet (50 mg total) by mouth every 6 (six) hours as needed. 30 tablet 0   Current Facility-Administered Medications  Medication Dose Route Frequency Provider Last Rate Last Admin   0.9 %  sodium chloride infusion  500 mL Intravenous Once Gatha Mayer, MD        Allergies as of 07/15/2022   (No Known Allergies)    Family History  Problem Relation Age of Onset   Colon polyps Father    Hypertension Other        Grandmother-and pituitary tumor   Colon cancer Neg Hx    Esophageal cancer Neg Hx    Rectal cancer Neg Hx    Stomach cancer Neg Hx     Social History   Socioeconomic History   Marital status: Married    Spouse name: Not on file   Number of children: Not on file   Years of education: Not on file   Highest education level: Not on file  Occupational History   Not on file  Tobacco  Use   Smoking status: Never   Smokeless tobacco: Never  Vaping Use   Vaping Use: Never used  Substance and Sexual Activity   Alcohol use: No   Drug use: No   Sexual activity: Not on file  Other Topics Concern   Not on file  Social History Narrative   Regular exercise-no   Social Determinants of Health   Financial Resource Strain: Not on file  Food Insecurity: Not on file  Transportation Needs: Not on file  Physical Activity: Not on file  Stress: Not on file  Social Connections: Not on file  Intimate Partner Violence: Not on file    Review of Systems:  All other review of systems negative except as mentioned in the HPI.  Physical Exam: Vital signs BP (!) 135/106   Pulse 95   Temp 97.8 F (36.6 C)   Ht '5\' 3"'$  (1.6 m)   Wt 217 lb (98.4 kg)   LMP 01/07/2012   SpO2 100%   BMI 38.44 kg/m   General:   Alert,  Well-developed, well-nourished, pleasant and cooperative in NAD Lungs:  Clear throughout to auscultation.   Heart:  Regular rate and rhythm; no murmurs, clicks, rubs,  or gallops. Abdomen:  Soft, nontender and nondistended. Normal bowel sounds.   Neuro/Psych:  Alert and cooperative. Normal mood and affect. A and O x 3   '@Signe Tackitt'$  Simonne Maffucci, MD, [facility] [phone] (pager) 07/15/2022 8:49 AM@

## 2022-07-15 ENCOUNTER — Ambulatory Visit (AMBULATORY_SURGERY_CENTER): Payer: 59 | Admitting: Internal Medicine

## 2022-07-15 ENCOUNTER — Encounter: Payer: Self-pay | Admitting: Internal Medicine

## 2022-07-15 VITALS — BP 130/94 | HR 80 | Temp 97.8°F | Resp 16 | Ht 63.0 in | Wt 217.0 lb

## 2022-07-15 DIAGNOSIS — Z1211 Encounter for screening for malignant neoplasm of colon: Secondary | ICD-10-CM

## 2022-07-15 DIAGNOSIS — K621 Rectal polyp: Secondary | ICD-10-CM

## 2022-07-15 DIAGNOSIS — D128 Benign neoplasm of rectum: Secondary | ICD-10-CM

## 2022-07-15 MED ORDER — SODIUM CHLORIDE 0.9 % IV SOLN: 500.0000 mL | Freq: Once | INTRAVENOUS | Status: DC

## 2022-07-15 NOTE — Op Note (Signed)
[facility] Patient Name: Michelle Davis Procedure Date: 07/15/2022 8:52 AM MRN: [id number] Endoscopist: Gatha Mayer , MD Age: 46 Referring MD:  Date of Birth: 09/07/1976 Gender: Female Account #: [id number] Procedure:                Colonoscopy Indications:              Screening for colorectal malignant neoplasm, This                            is the patient's first colonoscopy Medicines:                Monitored Anesthesia Care Procedure:                Pre-Anesthesia Assessment:                           - Prior to the procedure, a History and Physical                            was performed, and patient medications and                            allergies were reviewed. The patient's tolerance of                            previous anesthesia was also reviewed. The risks                            and benefits of the procedure and the sedation                            options and risks were discussed with the patient.                            All questions were answered, and informed consent                            was obtained. Prior Anticoagulants: The patient has                            taken no previous anticoagulant or antiplatelet                            agents. ASA Grade Assessment: II - A patient with                            mild systemic disease. After reviewing the risks                            and benefits, the patient was deemed in                            satisfactory condition to undergo the procedure.  After obtaining informed consent, the colonoscope                            was passed under direct vision. Throughout the                            procedure, the patient's blood pressure, pulse, and                            oxygen saturations were monitored continuously. The                            Colonoscope was introduced through the anus and                            advanced to the the  cecum, identified by                            appendiceal orifice and ileocecal valve. The                            colonoscopy was performed without difficulty. The                            patient tolerated the procedure well. The quality                            of the bowel preparation was good. The bowel                            preparation used was Miralax via split dose                            instruction. The ileocecal valve, appendiceal                            orifice, and rectum were photographed. Scope In: 8:58:37 AM Scope Out: 9:12:28 AM Scope Withdrawal Time: 0 hours 11 minutes 8 seconds  Total Procedure Duration: 0 hours 13 minutes 51 seconds  Findings:                 The perianal and digital rectal examinations were                            normal.                           A diminutive polyp was found in the rectum. The                            polyp was sessile. The polyp was removed with a                            cold snare. Resection and retrieval were complete.  Verification of patient identification for the                            specimen was done. Estimated blood loss was minimal.                           The exam was otherwise without abnormality on                            direct and retroflexion views. Complications:            No immediate complications. Estimated Blood Loss:     Estimated blood loss was minimal. Impression:               - One diminutive polyp in the rectum, removed with                            a cold snare. Resected and retrieved.                           - The examination was otherwise normal on direct                            and retroflexion views. Recommendation:           - Patient has a contact number available for                            emergencies. The signs and symptoms of potential                            delayed complications were discussed with the                             patient. Return to normal activities tomorrow.                            Written discharge instructions were provided to the                            patient.                           - Resume previous diet.                           - Continue present medications.                           - Repeat colonoscopy is recommended. The                            colonoscopy date will be determined after pathology                            results from today's exam become available for  review. Gatha Mayer, MD 07/15/2022 9:18:20 AM This report has been signed electronically.

## 2022-07-15 NOTE — Progress Notes (Signed)
Called to room to assist during endoscopic procedure.  Patient ID and intended procedure confirmed with present staff. Received instructions for my participation in the procedure from the performing physician.  

## 2022-07-15 NOTE — Progress Notes (Signed)
Pt's states no medical or surgical changes since previsit or office visit. 

## 2022-07-15 NOTE — Progress Notes (Signed)
Report given to PACU, vss 

## 2022-07-15 NOTE — Patient Instructions (Addendum)
I found and removed one tiny polyp from the rectum.  I will let you know pathology results and when to have another routine colonoscopy by mail and/or My Chart.  I appreciate the opportunity to care for you. Gatha Mayer, MD, [facility]  Please read handouts provided. Continue present medications. Await pathology results.   YOU HAD AN ENDOSCOPIC PROCEDURE TODAY AT [facility]:   Refer to the procedure report that was given to you for any specific questions about what was found during the examination.  If the procedure report does not answer your questions, please call your gastroenterologist to clarify.  If you requested that your care partner not be given the details of your procedure findings, then the procedure report has been included in a sealed envelope for you to review at your convenience later.  YOU SHOULD EXPECT: Some feelings of bloating in the abdomen. Passage of more gas than usual.  Walking can help get rid of the air that was put into your GI tract during the procedure and reduce the bloating. If you had a lower endoscopy (such as a colonoscopy or flexible sigmoidoscopy) you may notice spotting of blood in your stool or on the toilet paper. If you underwent a bowel prep for your procedure, you may not have a normal bowel movement for a few days.  Please Note:  You might notice some irritation and congestion in your nose or some drainage.  This is from the oxygen used during your procedure.  There is no need for concern and it should clear up in a day or so.  SYMPTOMS TO REPORT IMMEDIATELY:  Following lower endoscopy (colonoscopy or flexible sigmoidoscopy):  Excessive amounts of blood in the stool  Significant tenderness or worsening of abdominal pains  Swelling of the abdomen that is new, acute  Fever of 100F or higher.  For urgent or emergent issues, a gastroenterologist can be reached at any hour by calling [phone]. Do not use MyChart messaging for  urgent concerns.    DIET:  We do recommend a small meal at first, but then you may proceed to your regular diet.  Drink plenty of fluids but you should avoid alcoholic beverages for 24 hours.  ACTIVITY:  You should plan to take it easy for the rest of today and you should NOT DRIVE or use heavy machinery until tomorrow (because of the sedation medicines used during the test).    FOLLOW UP: Our staff will call the number listed on your records the next business day following your procedure.  We will call around 7:15- 8:00 am to check on you and address any questions or concerns that you may have regarding the information given to you following your procedure. If we do not reach you, we will leave a message.     If any biopsies were taken you will be contacted by phone or by letter within the next 1-3 weeks.  Please call us at [phone] if you have not heard about the biopsies in 3 weeks.    SIGNATURES/CONFIDENTIALITY: You and/or your care partner have signed paperwork which will be entered into your electronic medical record.  These signatures attest to the fact that that the information above on your After Visit Summary has been reviewed and is understood.  Full responsibility of the confidentiality of this discharge information lies with you and/or your care-partner.

## 2022-07-16 ENCOUNTER — Telehealth: Payer: Self-pay

## 2022-07-16 NOTE — Telephone Encounter (Signed)
  Follow up Call-     07/15/2022    8:24 AM  Call back number  Post procedure Call Back phone  # [phone]  Permission to leave phone message Yes     Patient questions:  Do you have a fever, pain , or abdominal swelling? No. Pain Score  0 *  Have you tolerated food without any problems? Yes.    Have you been able to return to your normal activities? Yes.    Do you have any questions about your discharge instructions: Diet   No. Medications  No. Follow up visit  No.  Do you have questions or concerns about your Care? No.  Actions: * If pain score is 4 or above: No action needed, pain <4.

## 2022-07-18 ENCOUNTER — Encounter: Payer: Self-pay | Admitting: Internal Medicine

## 2022-08-01 ENCOUNTER — Ambulatory Visit: Payer: 59 | Admitting: Internal Medicine

## 2022-08-16 ENCOUNTER — Other Ambulatory Visit: Payer: Self-pay | Admitting: Internal Medicine

## 2022-08-16 NOTE — Telephone Encounter (Signed)
Please refill as per office routine med refill policy (all routine meds to be refilled for 3 mo or monthly (per pt preference) up to one year from last visit, then month to month grace period for 3 mo, then further med refills will have to be denied) ? ?

## 2022-10-30 ENCOUNTER — Encounter: Payer: Self-pay | Admitting: Internal Medicine

## 2022-10-30 MED ORDER — TIRZEPATIDE 2.5 MG/0.5ML ~~LOC~~ SOAJ: 2.5000 mg | SUBCUTANEOUS | 11 refills | Status: DC

## 2022-10-30 NOTE — Telephone Encounter (Signed)
Patient states that insurance will no longer cover Rybelsus and she should switch to either Mounjaro or Trulicity. Please advice if she'll need an OV.

## 2022-10-30 NOTE — Telephone Encounter (Signed)
Ok for change rybelsus to [employer] - done erx

## 2022-11-28 ENCOUNTER — Telehealth: Payer: Self-pay | Admitting: Internal Medicine

## 2022-11-28 ENCOUNTER — Other Ambulatory Visit: Payer: Self-pay

## 2022-11-28 NOTE — Telephone Encounter (Signed)
Patient informed of lab orders via mychart

## 2022-11-28 NOTE — Telephone Encounter (Signed)
Patient called and would like to have her labs done a week before her appointment, would like to know if the orders can be put in so she can talk about results at the appointment.

## 2022-12-05 ENCOUNTER — Encounter: Payer: Self-pay | Admitting: Internal Medicine

## 2022-12-05 NOTE — Telephone Encounter (Signed)
Pt PA for [facility] send to plan  Key: [id number]

## 2022-12-06 NOTE — Telephone Encounter (Signed)
Pharmacy Patient Advocate Encounter  Prior Authorization for Michelle Davis has been approved.    PA# [id number] Effective dates: 11/05/2022 through 12/05/2023

## 2022-12-09 ENCOUNTER — Telehealth: Payer: Self-pay

## 2022-12-09 NOTE — Telephone Encounter (Signed)
Pt PA for tirzepatide Darcel Bayley  is send to plan  (Key: [id number])

## 2022-12-20 ENCOUNTER — Ambulatory Visit: Payer: 59 | Admitting: Internal Medicine

## 2022-12-24 ENCOUNTER — Ambulatory Visit (INDEPENDENT_AMBULATORY_CARE_PROVIDER_SITE_OTHER): Payer: 59 | Admitting: Internal Medicine

## 2022-12-24 VITALS — BP 146/94 | HR 73 | Temp 98.1°F | Ht 63.0 in | Wt 222.0 lb

## 2022-12-24 DIAGNOSIS — E1165 Type 2 diabetes mellitus with hyperglycemia: Secondary | ICD-10-CM

## 2022-12-24 DIAGNOSIS — E559 Vitamin D deficiency, unspecified: Secondary | ICD-10-CM

## 2022-12-24 DIAGNOSIS — Z23 Encounter for immunization: Secondary | ICD-10-CM | POA: Diagnosis not present

## 2022-12-24 DIAGNOSIS — E7800 Pure hypercholesterolemia, unspecified: Secondary | ICD-10-CM

## 2022-12-24 DIAGNOSIS — E039 Hypothyroidism, unspecified: Secondary | ICD-10-CM

## 2022-12-24 DIAGNOSIS — I10 Essential (primary) hypertension: Secondary | ICD-10-CM | POA: Diagnosis not present

## 2022-12-24 DIAGNOSIS — Z0001 Encounter for general adult medical examination with abnormal findings: Secondary | ICD-10-CM

## 2022-12-24 MED ORDER — TIRZEPATIDE 5 MG/0.5ML ~~LOC~~ SOAJ: 5.0000 mg | SUBCUTANEOUS | 11 refills | Status: DC

## 2022-12-24 NOTE — Patient Instructions (Addendum)
You had the Tdap today  Please remember to call for your yearly eye exam  Ok to increase the mounjaro to 5 mg weekly - we will need to do the PA for [employer] approval most likely  Please continue all other medications as before, and refills have been done if requested.  Please have the pharmacy call with any other refills you may need.  Please continue your efforts at being more active, low cholesterol diet, and weight control.  You are otherwise up to date with prevention measures today.  Please keep your appointments with your specialists as you may have planned  Please go to the LAB at the blood drawing area for the tests to be done - at your convenience  You will be contacted by phone if any changes need to be made immediately.  Otherwise, you will receive a letter about your results with an explanation, but please check with MyChart first.  Please remember to sign up for MyChart if you have not done so, as this will be important to you in the future with finding out test results, communicating by private email, and scheduling acute appointments online when needed.  Please make an Appointment to return in 6 months, or sooner if needed, also with Lab Appointment for testing done 3-5 days before at the [place] (so this is for TWO appointments - please see the scheduling desk as you leave)

## 2022-12-24 NOTE — Progress Notes (Signed)
Patient ID: Michelle Davis, female   DOB: [birth year], 47 y.o.   MRN: [id number]         Chief Complaint:: wellness exam and 63mofollow up (Switched from rybelsus to mounjaro)  , dm, htn, hld, low thyroid, low vit d       HPI:  KREBCCA KNIPFERis a 47y.o. female here for wellness exam; for tdap today, declines covid booster, plans to call for eye exam soon, o/w up to date                        Also wt at home has come down 224.6 at home 6 wks ago, then later 219, has been tolerating mounjaro soon.  Pt denies chest pain, increased sob or doe, wheezing, orthopnea, PND, increased LE swelling, palpitations, dizziness or syncope.   Pt denies polydipsia, polyuria, or new focal neuro s/s.   .Marland KitchenPt denies fever, wt loss, night sweats, loss of appetite, or other constitutional symptoms  [place]  Wt Readings from Last 3 Encounters:  12/24/22 222 lb (100.7 kg)  07/15/22 217 lb (98.4 kg)  06/19/22 221 lb (100.2 kg)   BP Readings from Last 3 Encounters:  12/24/22 (!) 146/94  07/15/22 (!) 130/94  06/19/22 124/68   Immunization History  Administered Date(s) Administered   PFIZER(Purple Top)SARS-COV-2 Vaccination 12/02/2020   Tdap 12/24/2022   Health Maintenance Due  Topic Date Due   HEMOGLOBIN A1C  12/20/2022   Diabetic kidney evaluation - Urine ACR  01/23/2023      Past Medical History:  Diagnosis Date   Diabetes mellitus without complication ([place]    Hyperlipidemia    Hypertension    Hypothyroidism 03/05/2016   Past Surgical History:  Procedure Laterality Date   ABDOMINAL HYSTERECTOMY  06/02/2017   (total)   TUBAL LIGATION  02/2000   WISDOM TOOTH EXTRACTION      reports that she has never smoked. She has never used smokeless tobacco. She reports that she does not drink alcohol and does not use drugs. family history includes Colon polyps in her father; Hypertension in an other family member. No Known Allergies Current Outpatient Medications on File Prior to  Visit  Medication Sig Dispense Refill   acetaminophen (TYLENOL) 500 MG tablet Take 500 mg by mouth every 6 (six) hours as needed.     amLODipine (NORVASC) 5 MG tablet TAKE 1 TABLET BY MOUTH EVERY DAY 90 tablet 2   aspirin 81 MG EC tablet Take 1 tablet (81 mg total) by mouth daily. Swallow whole. 30 tablet 12   cetirizine (ZYRTEC) 10 MG tablet Take 10 mg by mouth daily.     cyclobenzaprine (FLEXERIL) 5 MG tablet Take 1 tablet (5 mg total) by mouth 3 (three) times daily as needed for muscle spasms. 40 tablet 1   estradiol (ESTRACE) 1 MG tablet Take 1 mg by mouth daily.     irbesartan (AVAPRO) 150 MG tablet TAKE 1 TABLET BY MOUTH EVERY DAY 90 tablet 1   levothyroxine (SYNTHROID) 88 MCG tablet TAKE 1 TABLET BY MOUTH EVERY DAY 90 tablet 2   meloxicam (MOBIC) 15 MG tablet Take 1 tablet by mouth daily.     rosuvastatin (CRESTOR) 20 MG tablet Take 1 tablet (20 mg total) by mouth daily. 90 tablet 2   traMADol (ULTRAM) 50 MG tablet Take 1 tablet (50 mg total) by mouth every 6 (six) hours as needed. 30 tablet 0   VITAMIN  D PO Take 2 tablets by mouth daily.     No current facility-administered medications on file prior to visit.        ROS:  All others reviewed and negative.  Objective        PE:  BP (!) 146/94 (BP Location: Right Arm, Patient Position: Sitting, Cuff Size: Large)   Pulse 73   Temp 98.1 F (36.7 C) (Oral)   Ht '5\' 3"'$  (1.6 m)   Wt 222 lb (100.7 kg)   LMP 01/07/2012   SpO2 96%   BMI 39.33 kg/m                 Constitutional: Pt appears in NAD               HENT: Head: NCAT.                Right Ear: External ear normal.                 Left Ear: External ear normal.                Eyes: . Pupils are equal, round, and reactive to light. Conjunctivae and EOM are normal               Nose: without d/c or deformity               Neck: Neck supple. Gross normal ROM               Cardiovascular: Normal rate and regular rhythm.                 Pulmonary/Chest: Effort normal and  breath sounds without rales or wheezing.                Abd:  Soft, NT, ND, + BS, no organomegaly               Neurological: Pt is alert. At baseline orientation, motor grossly intact               Skin: Skin is warm. No rashes, no other new lesions, LE edema - none               Psychiatric: Pt behavior is normal without agitation   Micro: none  Cardiac tracings I have personally interpreted today:  none  Pertinent Radiological findings (summarize): none   Lab Results  Component Value Date   WBC 7.7 01/22/2022   HGB 14.6 01/22/2022   HCT 42.6 01/22/2022   PLT 225.0 01/22/2022   GLUCOSE 90 06/19/2022   CHOL 148 06/19/2022   TRIG 80.0 06/19/2022   HDL 61.20 06/19/2022   LDLDIRECT 172.9 06/30/2014   LDLCALC 71 06/19/2022   ALT 12 06/19/2022   AST 15 06/19/2022   NA 140 06/19/2022   K 4.2 06/19/2022   CL 102 06/19/2022   CREATININE 0.69 06/19/2022   BUN 15 06/19/2022   CO2 26 06/19/2022   TSH 3.52 01/22/2022   HGBA1C 6.0 06/19/2022   MICROALBUR <0.7 01/22/2022   Assessment/Plan:  [name] is a 47 y.o. White or Caucasian [1] female with  has a past medical history of Diabetes mellitus without complication ([place]), Hyperlipidemia, Hypertension, and Hypothyroidism (03/05/2016).  Encounter for well adult exam with abnormal findings Age and sex appropriate education and counseling updated with regular exercise and diet Referrals for preventative services - pt states will call for eye exam soon Immunizations addressed - for Tdap today, declines covid  booster Smoking counseling  - none needed Evidence for depression or other mood disorder - none significant Most recent labs reviewed. I have personally reviewed and have noted: 1) the patient's medical and social history 2) The patient's current medications and supplements 3) The patient's height, weight, and BMI have been recorded in the chart   Diabetes [facility]) Lab Results  Component Value Date   HGBA1C 6.0  06/19/2022   Stable overall but in light of obesity will need increasesed mounjaor to 5 mg weekle   Essential hypertension BP Readings from Last 3 Encounters:  12/24/22 (!) 146/94  07/15/22 (!) 130/94  06/19/22 124/68   Stable, pt to continue medical treatment avapro 150 gm qd, norvasc 5 mg qd   Hyperlipidemia Lab Results  Component Value Date   LDLCALC 71 06/19/2022   Uncontrolled, goal ldl < 70,, pt to continue current statin crestor 20 mg and lower chol diet, declines further change   Hypothyroidism Lab Results  Component Value Date   TSH 3.52 01/22/2022   Stable, pt to continue levothyroxine 88 mcg qd   Vitamin D deficiency Last vitamin D Lab Results  Component Value Date   VD25OH 48.48 06/19/2022   Stable, cont oral replacement  Followup: Return in about 6 months (around 06/24/2023).  Cathlean Cower, MD 12/28/2022 7:04 AM [facility]

## 2022-12-28 ENCOUNTER — Encounter: Payer: Self-pay | Admitting: Internal Medicine

## 2022-12-28 NOTE — Assessment & Plan Note (Signed)
Lab Results  Component Value Date   TSH 3.52 01/22/2022   Stable, pt to continue levothyroxine 88 mcg qd

## 2022-12-28 NOTE — Assessment & Plan Note (Signed)
Lab Results  Component Value Date   HGBA1C 6.0 06/19/2022   Stable overall but in light of obesity will need increasesed mounjaor to 5 mg weekle

## 2022-12-28 NOTE — Assessment & Plan Note (Signed)
Lab Results  Component Value Date   LDLCALC 71 06/19/2022   Uncontrolled, goal ldl < 70,, pt to continue current statin crestor 20 mg and lower chol diet, declines further change

## 2022-12-28 NOTE — Assessment & Plan Note (Signed)
BP Readings from Last 3 Encounters:  12/24/22 (!) 146/94  07/15/22 (!) 130/94  06/19/22 124/68   Stable, pt to continue medical treatment avapro 150 gm qd, norvasc 5 mg qd

## 2022-12-28 NOTE — Assessment & Plan Note (Signed)
Last vitamin D Lab Results  Component Value Date   VD25OH 48.48 06/19/2022   Stable, cont oral replacement

## 2022-12-28 NOTE — Assessment & Plan Note (Signed)
Age and sex appropriate education and counseling updated with regular exercise and diet Referrals for preventative services - pt states will call for eye exam soon Immunizations addressed - for Tdap today, declines covid booster Smoking counseling  - none needed Evidence for depression or other mood disorder - none significant Most recent labs reviewed. I have personally reviewed and have noted: 1) the patient's medical and social history 2) The patient's current medications and supplements 3) The patient's height, weight, and BMI have been recorded in the chart

## 2023-01-03 ENCOUNTER — Other Ambulatory Visit (INDEPENDENT_AMBULATORY_CARE_PROVIDER_SITE_OTHER): Payer: 59

## 2023-01-03 DIAGNOSIS — E538 Deficiency of other specified B group vitamins: Secondary | ICD-10-CM

## 2023-01-03 DIAGNOSIS — E1165 Type 2 diabetes mellitus with hyperglycemia: Secondary | ICD-10-CM

## 2023-01-03 DIAGNOSIS — E559 Vitamin D deficiency, unspecified: Secondary | ICD-10-CM

## 2023-01-03 LAB — CBC WITH DIFFERENTIAL/PLATELET
Basophils Absolute: 0 10*3/uL (ref 0.0–0.1)
Basophils Relative: 0.6 % (ref 0.0–3.0)
Eosinophils Absolute: 0.3 10*3/uL (ref 0.0–0.7)
Eosinophils Relative: 4.3 % (ref 0.0–5.0)
HCT: 43 % (ref 36.0–46.0)
Hemoglobin: 14.7 g/dL (ref 12.0–15.0)
Lymphocytes Relative: 43.2 % (ref 12.0–46.0)
Lymphs Abs: 2.9 10*3/uL (ref 0.7–4.0)
MCHC: 34.2 g/dL (ref 30.0–36.0)
MCV: 88.8 fl (ref 78.0–100.0)
Monocytes Absolute: 0.7 10*3/uL (ref 0.1–1.0)
Monocytes Relative: 9.7 % (ref 3.0–12.0)
Neutro Abs: 2.9 10*3/uL (ref 1.4–7.7)
Neutrophils Relative %: 42.2 % — ABNORMAL LOW (ref 43.0–77.0)
Platelets: 242 10*3/uL (ref 150.0–400.0)
RBC: 4.84 Mil/uL (ref 3.87–5.11)
RDW: 12 % (ref 11.5–15.5)
WBC: 6.8 10*3/uL (ref 4.0–10.5)

## 2023-01-03 LAB — BASIC METABOLIC PANEL
BUN: 16 mg/dL (ref 6–23)
CO2: 23 mEq/L (ref 19–32)
Calcium: 9.6 mg/dL (ref 8.4–10.5)
Chloride: 105 mEq/L (ref 96–112)
Creatinine, Ser: 0.85 mg/dL (ref 0.40–1.20)
GFR: 80.75 mL/min (ref >60.00)
Glucose, Bld: 94 mg/dL (ref 70–99)
Potassium: 4 mEq/L (ref 3.5–5.1)
Sodium: 140 mEq/L (ref 135–145)

## 2023-01-03 LAB — MICROALBUMIN / CREATININE URINE RATIO
Creatinine,U: 344.6 mg/dL
Microalb Creat Ratio: 0.5 mg/g (ref 0.0–30.0)
Microalb, Ur: 1.6 mg/dL (ref 0.0–1.9)

## 2023-01-03 LAB — URINALYSIS, ROUTINE W REFLEX MICROSCOPIC
Hgb urine dipstick: NEGATIVE
Leukocytes,Ua: NEGATIVE
Nitrite: NEGATIVE
Specific Gravity, Urine: >=1.03 — AB (ref 1.000–1.030)
Total Protein, Urine: NEGATIVE
Urine Glucose: NEGATIVE
Urobilinogen, UA: 0.2 (ref 0.0–1.0)
pH: 5.5 (ref 5.0–8.0)

## 2023-01-03 LAB — HEPATIC FUNCTION PANEL
ALT: 19 U/L (ref 0–35)
AST: 19 U/L (ref 0–37)
Albumin: 3.9 g/dL (ref 3.5–5.2)
Alkaline Phosphatase: 51 U/L (ref 39–117)
Bilirubin, Direct: 0.1 mg/dL (ref 0.0–0.3)
Total Bilirubin: 0.4 mg/dL (ref 0.2–1.2)
Total Protein: 7 g/dL (ref 6.0–8.3)

## 2023-01-03 LAB — LIPID PANEL
Cholesterol: 126 mg/dL (ref 0–200)
HDL: 46.1 mg/dL (ref >39.00)
LDL Cholesterol: 58 mg/dL (ref 0–99)
NonHDL: 80.23
Total CHOL/HDL Ratio: 3
Triglycerides: 111 mg/dL (ref 0.0–149.0)
VLDL: 22.2 mg/dL (ref 0.0–40.0)

## 2023-01-03 LAB — HEMOGLOBIN A1C: Hgb A1c MFr Bld: 5.8 % (ref 4.6–6.5)

## 2023-01-03 LAB — VITAMIN D 25 HYDROXY (VIT D DEFICIENCY, FRACTURES): VITD: 37.07 ng/mL (ref 30.00–100.00)

## 2023-01-03 LAB — TSH: TSH: 1.86 u[IU]/mL (ref 0.35–5.50)

## 2023-01-03 LAB — VITAMIN B12: Vitamin B-12: 263 pg/mL (ref 211–911)

## 2023-01-03 NOTE — Progress Notes (Signed)
The test results show that your current treatment is OK, as the tests are stable.  Please continue the same plan.  There is no other need for change of treatment or further evaluation based on these results, at this time.  thanks 

## 2023-01-24 ENCOUNTER — Telehealth: Payer: Self-pay | Admitting: Pharmacy Technician

## 2023-01-24 NOTE — Telephone Encounter (Signed)
Patient Advocate Encounter  Prior Authorization for Rybelsus 3MG  tablets has been approved.    PA# [id number] Key: [id number] Insurance [employer] Electronic PA Form Effective dates: 12/25/2022 through 01/24/2024

## 2023-01-28 ENCOUNTER — Other Ambulatory Visit: Payer: Self-pay | Admitting: Internal Medicine

## 2023-02-15 ENCOUNTER — Other Ambulatory Visit: Payer: Self-pay | Admitting: Internal Medicine

## 2023-02-21 ENCOUNTER — Encounter: Payer: Self-pay | Admitting: Internal Medicine

## 2023-02-21 ENCOUNTER — Other Ambulatory Visit: Payer: Self-pay

## 2023-03-03 ENCOUNTER — Other Ambulatory Visit: Payer: Self-pay | Admitting: Internal Medicine

## 2023-03-03 DIAGNOSIS — E559 Vitamin D deficiency, unspecified: Secondary | ICD-10-CM

## 2023-03-03 DIAGNOSIS — E1165 Type 2 diabetes mellitus with hyperglycemia: Secondary | ICD-10-CM

## 2023-03-04 ENCOUNTER — Encounter: Payer: Self-pay | Admitting: Internal Medicine

## 2023-03-04 NOTE — Telephone Encounter (Signed)
Michelle Davis with Michelle Davis has called and stated pts is needing a PA for her MounJaro 5mg  and asked that the PA be sent in as URGENT.

## 2023-03-05 NOTE — Telephone Encounter (Signed)
Submitted PA w/ Key: [id number]. Waiting on insurance response.Marland KitchenRaechel Chute

## 2023-03-06 ENCOUNTER — Other Ambulatory Visit (HOSPITAL_COMMUNITY): Payer: Self-pay

## 2023-03-06 NOTE — Telephone Encounter (Signed)
Check PA status PA still pending.Marland KitchenRaechel Chute

## 2023-03-13 ENCOUNTER — Other Ambulatory Visit (HOSPITAL_COMMUNITY): Payer: Self-pay

## 2023-03-13 NOTE — Telephone Encounter (Signed)
FYI../lmb 

## 2023-03-13 NOTE — Telephone Encounter (Signed)
Called patients pharmacy and Mounjaro 5mg  is on backorder, patient picked up 2.5mg  on 03/08/2023

## 2023-03-17 ENCOUNTER — Encounter: Payer: Self-pay | Admitting: Internal Medicine

## 2023-03-18 ENCOUNTER — Other Ambulatory Visit (HOSPITAL_COMMUNITY): Payer: Self-pay

## 2023-03-18 ENCOUNTER — Telehealth: Payer: Self-pay

## 2023-03-18 ENCOUNTER — Telehealth: Payer: Self-pay | Admitting: Internal Medicine

## 2023-03-18 NOTE — Telephone Encounter (Signed)
Pt called stating  all the pharmacy is out of Monjaro 5 so pt took the 2 of the 2.5 pens, so she is out of her medication. Please advise.

## 2023-03-18 NOTE — Telephone Encounter (Signed)
Pharmacy Patient Advocate Encounter   Received notification that prior authorization for Mounjaro 5mg /0.66ml is required/requested.   PA submitted on 03/18/23 to (ins) [employer] via telephone at [phone]  Prior Authorization has been approved   PA# [id number] Effective dates: 03/18/23 through 03/17/24

## 2023-03-19 ENCOUNTER — Other Ambulatory Visit (HOSPITAL_COMMUNITY): Payer: Self-pay

## 2023-03-19 MED ORDER — TIRZEPATIDE 5 MG/0.5ML ~~LOC~~ SOAJ: 5.0000 mg | SUBCUTANEOUS | 11 refills | Status: DC

## 2023-03-19 MED ORDER — TIRZEPATIDE 7.5 MG/0.5ML ~~LOC~~ SOAJ: 7.5000 mg | SUBCUTANEOUS | 3 refills | Status: DC

## 2023-03-19 NOTE — Telephone Encounter (Signed)
Pharmacy inform her all pharmacy was out of the [facility] 5mg . So she may need to go up next dosage. See pt 1st message...Michelle Davis

## 2023-03-19 NOTE — Telephone Encounter (Signed)
Done erx 

## 2023-03-19 NOTE — Telephone Encounter (Signed)
Ok for early refill - done erx

## 2023-03-20 ENCOUNTER — Other Ambulatory Visit (HOSPITAL_COMMUNITY): Payer: Self-pay

## 2023-03-20 MED ORDER — MOUNJARO 5 MG/0.5ML ~~LOC~~ SOAJ
5.0000 mg | SUBCUTANEOUS | 33 refills | Status: DC
  Filled 2023-03-20 (×2): qty 2, 28d supply, fill #0
  Filled 2023-04-12: qty 2, 28d supply, fill #1
  Filled 2023-05-10: qty 2, 28d supply, fill #2
  Filled 2023-06-07: qty 2, 28d supply, fill #3

## 2023-03-20 NOTE — Telephone Encounter (Signed)
Called pt inform MD [facility] to [facility]. [facility]mg  and 7.5mg . Called [facility] and she will be getting from them instead.Marland KitchenRaechel Chute

## 2023-03-23 IMAGING — DX DG SACRUM/COCCYX 2+V
3 series · 3 of 3 positions shown · non-contrast
Comparison: None.

CLINICAL DATA: Central tailbone pain for 9 months

EXAM:
SACRUM AND COCCYX - 2+ VIEW

[sacrum ap]
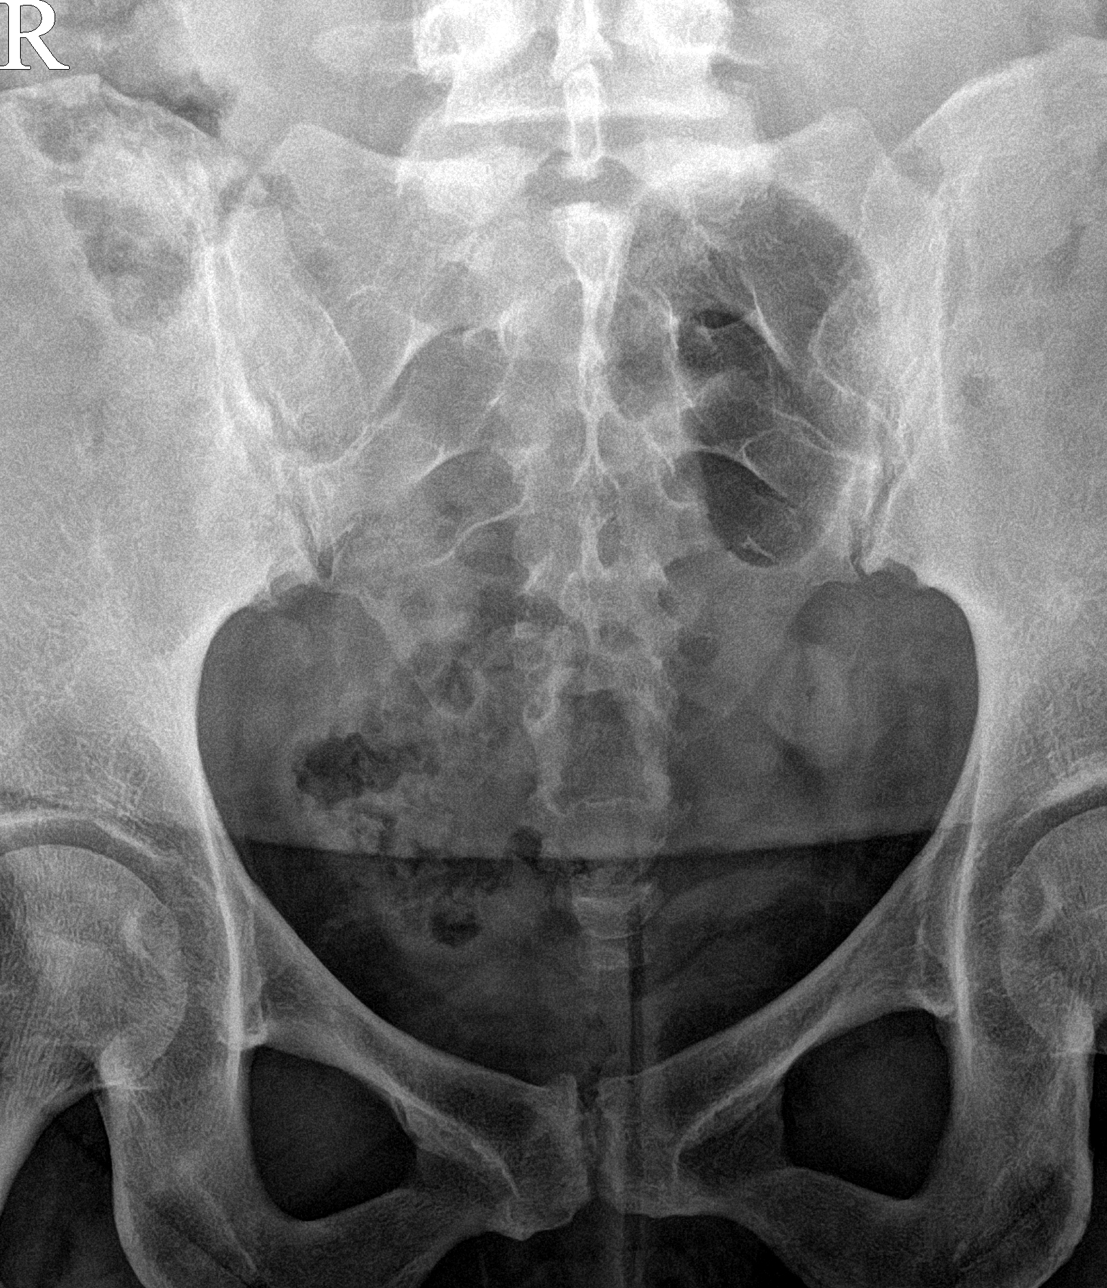

[coccyx ap]
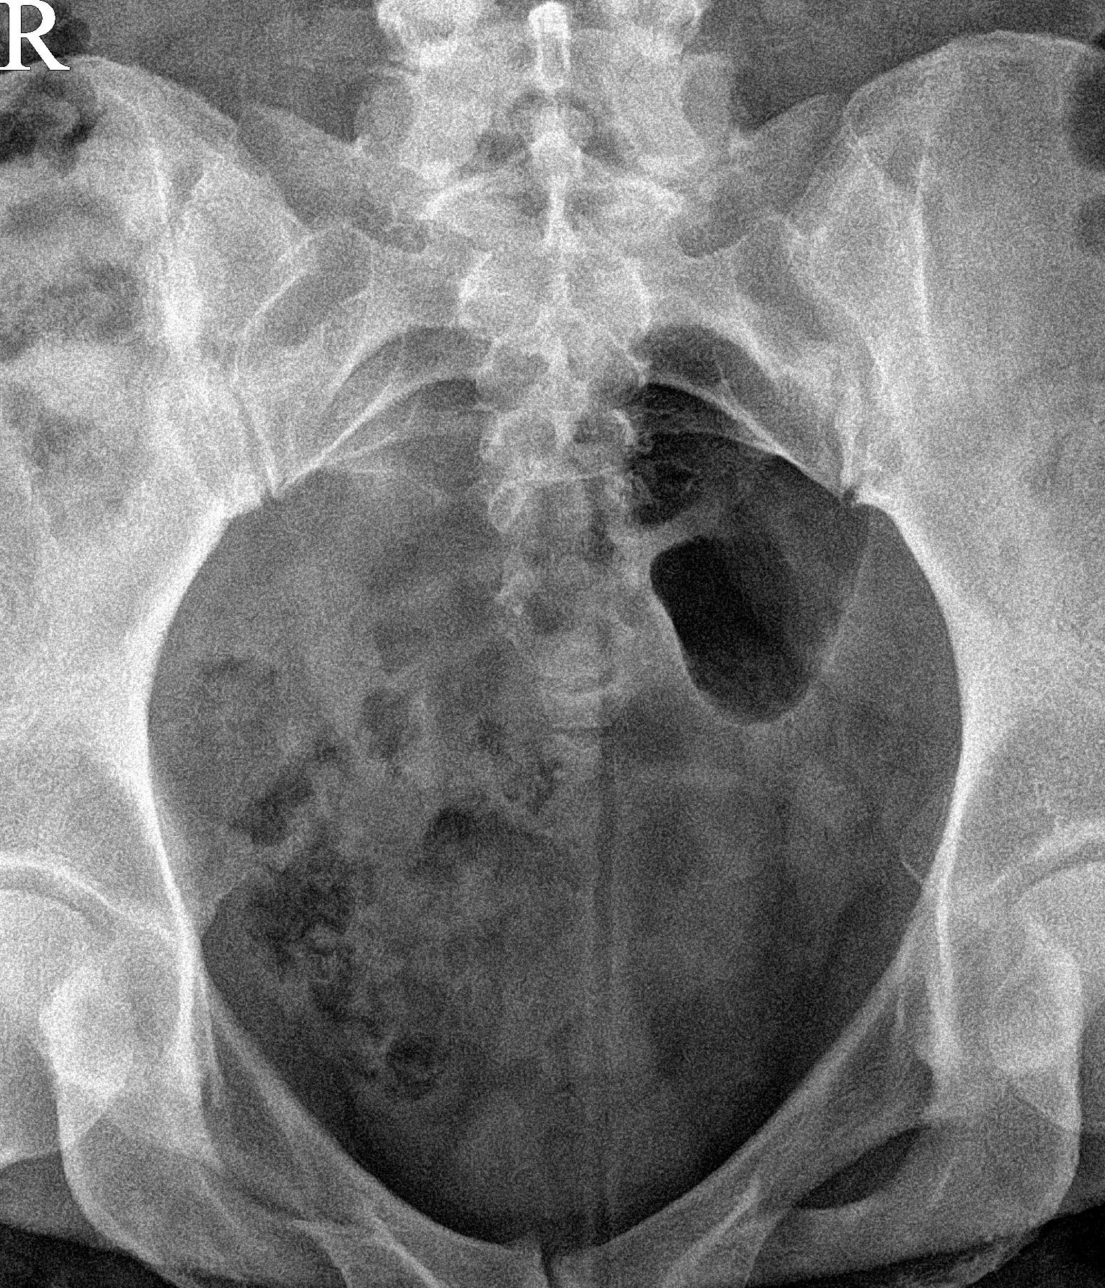

[sacrum lat]
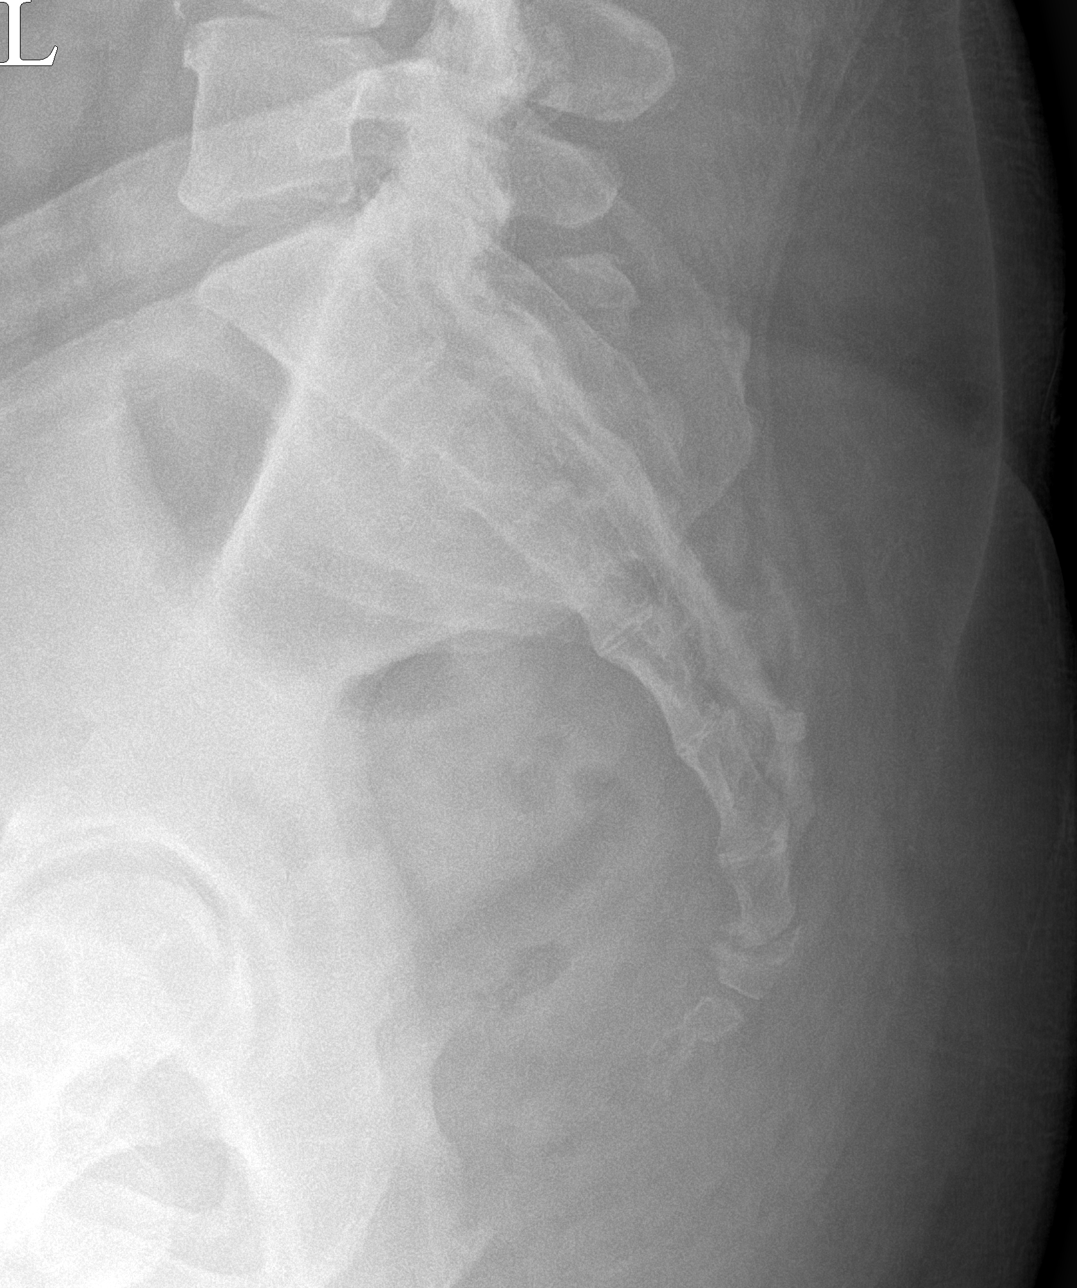

[3 of 3 positions shown; findings below may reference images not displayed]

FINDINGS: Frontal and lateral views of the sacrum and coccyx are obtained. No
acute displaced fractures. Mild spurring at the sacrococcygeal
junction. Sacroiliac joints are unremarkable. Soft tissues are
unremarkable.
IMPRESSION: 1. Mild spurring at the sacrococcygeal junction, compatible with
degenerative changes or sequela of chronic injury.
2. No acute displaced fracture.

## 2023-03-23 IMAGING — DX DG PELVIS 1-2V
1 series · 1 of 1 positions shown · non-contrast
Comparison: None.

CLINICAL DATA: Central tailbone pain for 9 months

EXAM:
PELVIS - 1-2 VIEW

[pelvis ap]
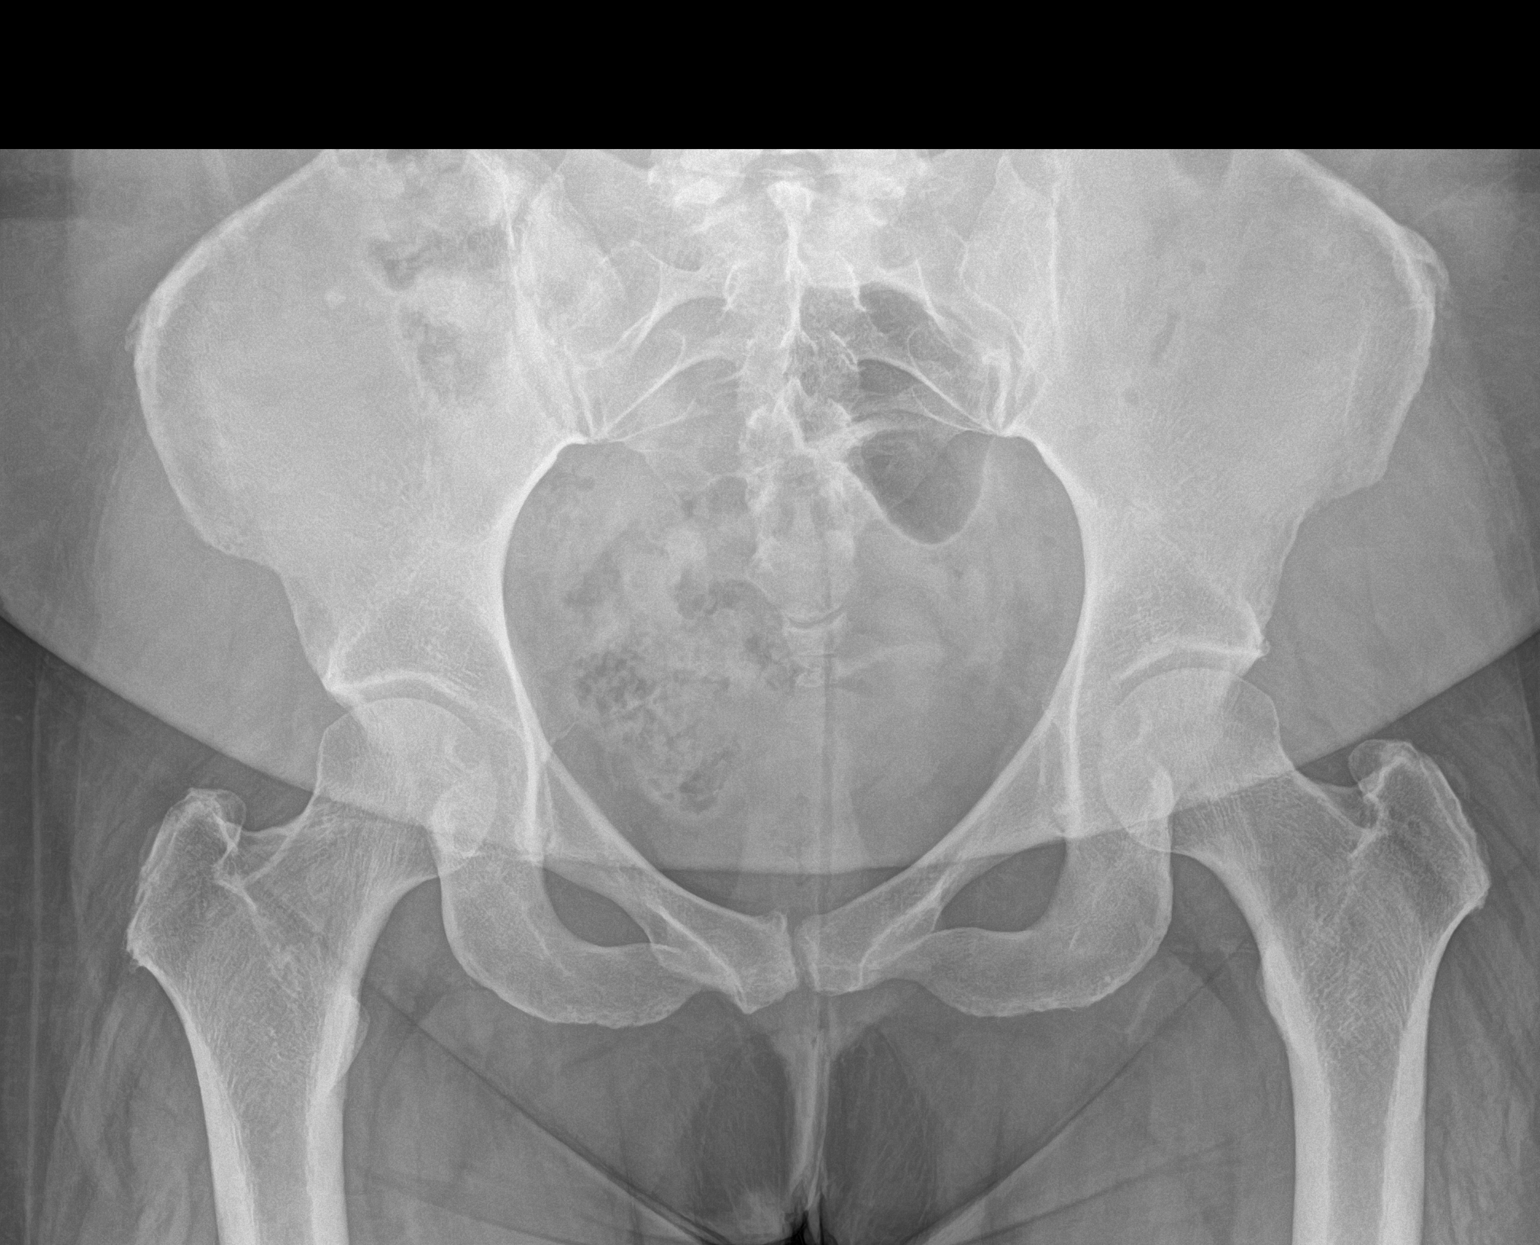

[1 of 1 positions shown; findings below may reference images not displayed]

FINDINGS: Single frontal view of the pelvis includes both hips. No fractures.
Alignment is anatomic. Joint spaces are well preserved. Sacroiliac
joints are unremarkable.
IMPRESSION: 1. Unremarkable bony pelvis.

## 2023-04-15 ENCOUNTER — Other Ambulatory Visit (HOSPITAL_COMMUNITY): Payer: Self-pay

## 2023-04-23 ENCOUNTER — Other Ambulatory Visit: Payer: Self-pay | Admitting: Internal Medicine

## 2023-04-24 ENCOUNTER — Encounter: Payer: Self-pay | Admitting: Internal Medicine

## 2023-04-24 MED ORDER — TEMAZEPAM 15 MG PO CAPS: 15.0000 mg | ORAL_CAPSULE | Freq: Every evening | ORAL | 0 refills | Status: DC | PRN

## 2023-04-24 MED ORDER — CIPROFLOXACIN HCL 500 MG PO TABS: 500.0000 mg | ORAL_TABLET | Freq: Two times a day (BID) | ORAL | 0 refills | Status: AC

## 2023-04-24 NOTE — Telephone Encounter (Signed)
Ok done erx 

## 2023-04-27 ENCOUNTER — Other Ambulatory Visit: Payer: Self-pay | Admitting: Internal Medicine

## 2023-04-28 ENCOUNTER — Other Ambulatory Visit: Payer: Self-pay

## 2023-05-28 ENCOUNTER — Encounter (INDEPENDENT_AMBULATORY_CARE_PROVIDER_SITE_OTHER): Payer: Self-pay

## 2023-06-20 ENCOUNTER — Other Ambulatory Visit (INDEPENDENT_AMBULATORY_CARE_PROVIDER_SITE_OTHER): Payer: 59

## 2023-06-20 DIAGNOSIS — E1165 Type 2 diabetes mellitus with hyperglycemia: Secondary | ICD-10-CM | POA: Diagnosis not present

## 2023-06-20 DIAGNOSIS — E559 Vitamin D deficiency, unspecified: Secondary | ICD-10-CM

## 2023-06-20 LAB — BASIC METABOLIC PANEL
BUN: 13 mg/dL (ref 6–23)
CO2: 26 mEq/L (ref 19–32)
Calcium: 8.8 mg/dL (ref 8.4–10.5)
Chloride: 105 mEq/L (ref 96–112)
Creatinine, Ser: 0.85 mg/dL (ref 0.40–1.20)
GFR: 80.49 mL/min (ref >60.00)
Glucose, Bld: 90 mg/dL (ref 70–99)
Potassium: 4.1 mEq/L (ref 3.5–5.1)
Sodium: 139 mEq/L (ref 135–145)

## 2023-06-20 LAB — LIPID PANEL
Cholesterol: 154 mg/dL (ref 0–200)
HDL: 52.2 mg/dL (ref >39.00)
LDL Cholesterol: 75 mg/dL (ref 0–99)
NonHDL: 102.09
Total CHOL/HDL Ratio: 3
Triglycerides: 136 mg/dL (ref 0.0–149.0)
VLDL: 27.2 mg/dL (ref 0.0–40.0)

## 2023-06-20 LAB — HEPATIC FUNCTION PANEL
ALT: 13 U/L (ref 0–35)
AST: 14 U/L (ref 0–37)
Albumin: 3.9 g/dL (ref 3.5–5.2)
Alkaline Phosphatase: 50 U/L (ref 39–117)
Bilirubin, Direct: 0.1 mg/dL (ref 0.0–0.3)
Total Bilirubin: 0.4 mg/dL (ref 0.2–1.2)
Total Protein: 7 g/dL (ref 6.0–8.3)

## 2023-06-20 LAB — HEMOGLOBIN A1C: Hgb A1c MFr Bld: 5.7 % (ref 4.6–6.5)

## 2023-06-20 LAB — VITAMIN D 25 HYDROXY (VIT D DEFICIENCY, FRACTURES): VITD: 57.23 ng/mL (ref 30.00–100.00)

## 2023-06-24 ENCOUNTER — Encounter: Payer: Self-pay | Admitting: Internal Medicine

## 2023-06-24 ENCOUNTER — Ambulatory Visit: Payer: 59 | Admitting: Internal Medicine

## 2023-06-24 ENCOUNTER — Other Ambulatory Visit (HOSPITAL_COMMUNITY): Payer: Self-pay

## 2023-06-24 VITALS — BP 134/86 | HR 70 | Temp 98.2°F | Ht 63.0 in | Wt 219.0 lb

## 2023-06-24 DIAGNOSIS — I10 Essential (primary) hypertension: Secondary | ICD-10-CM | POA: Diagnosis not present

## 2023-06-24 DIAGNOSIS — E538 Deficiency of other specified B group vitamins: Secondary | ICD-10-CM

## 2023-06-24 DIAGNOSIS — E1165 Type 2 diabetes mellitus with hyperglycemia: Secondary | ICD-10-CM | POA: Diagnosis not present

## 2023-06-24 DIAGNOSIS — E7800 Pure hypercholesterolemia, unspecified: Secondary | ICD-10-CM | POA: Diagnosis not present

## 2023-06-24 DIAGNOSIS — E559 Vitamin D deficiency, unspecified: Secondary | ICD-10-CM

## 2023-06-24 MED ORDER — LEVOTHYROXINE SODIUM 88 MCG PO TABS: 88.0000 ug | ORAL_TABLET | Freq: Every day | ORAL | 3 refills | Status: DC

## 2023-06-24 MED ORDER — IRBESARTAN 150 MG PO TABS: 150.0000 mg | ORAL_TABLET | Freq: Every day | ORAL | 3 refills | Status: DC

## 2023-06-24 MED ORDER — TIRZEPATIDE 7.5 MG/0.5ML ~~LOC~~ SOAJ
7.5000 mg | SUBCUTANEOUS | 11 refills | Status: DC
  Filled 2023-06-24: qty 2, 28d supply, fill #0
  Filled 2023-07-18: qty 6, 84d supply, fill #1
  Filled 2023-10-11: qty 2, 28d supply, fill #2
  Filled 2023-11-07: qty 2, 28d supply, fill #3
  Filled 2023-12-05: qty 2, 28d supply, fill #4
  Filled 2024-01-02: qty 2, 28d supply, fill #5
  Filled 2024-02-01: qty 2, 28d supply, fill #6
  Filled 2024-02-27: qty 2, 28d supply, fill #7
  Filled 2024-04-05 – 2024-04-09 (×3): qty 2, 28d supply, fill #8
  Filled 2024-05-08: qty 2, 28d supply, fill #9

## 2023-06-24 MED ORDER — AMLODIPINE BESYLATE 5 MG PO TABS: 5.0000 mg | ORAL_TABLET | Freq: Every day | ORAL | 3 refills | Status: DC

## 2023-06-24 NOTE — Patient Instructions (Signed)
Please continue all other medications as before, and refills have been done if requested.  Please have the pharmacy call with any other refills you may need.  Please continue your efforts at being more active, low cholesterol diet, and weight control.  You are otherwise up to date with prevention measures today.  Please keep your appointments with your specialists as you may have planned  Please make an Appointment to return in 6 months, or sooner if needed, also with Lab Appointment for testing done 3-5 days before at the FIRST FLOOR Lab (so this is for TWO appointments - please see the scheduling desk as you leave)  

## 2023-06-24 NOTE — Assessment & Plan Note (Signed)
Lab Results  Component Value Date   LDLCALC 75 06/20/2023   Uncontrolled, goal ld < 70, pt to continue current statin crestor 20 every day and lower chol diet, declines change for now

## 2023-06-24 NOTE — Assessment & Plan Note (Signed)
Last vitamin D Lab Results  Component Value Date   VD25OH 57.23 06/20/2023   Stable, cont oral replacement

## 2023-06-24 NOTE — Assessment & Plan Note (Signed)
Lab Results  Component Value Date   HGBA1C 5.7 06/20/2023   With persistent uncontrolled obesity, pt for increased mounjaro 7.5 mg weekly

## 2023-06-24 NOTE — Progress Notes (Signed)
Patient ID: Michelle Davis, female   DOB: 1976/01/26, 47 y.o.   MRN: [id number]        Chief Complaint: follow up HTN, HLD and hyperglycemia , low vit d      HPI:  Michelle Davis is a 47 y.o. female here overall doing ok, Pt denies chest pain, increased sob or doe, wheezing, orthopnea, PND, increased LE swelling, palpitations, dizziness or syncope.   Pt denies polydipsia, polyuria, or new focal neuro s/s.    Pt denies fever, wt loss, night sweats, loss of appetite, or other constitutional symptoms         Wt Readings from Last 3 Encounters:  06/24/23 219 lb (99.3 kg)  12/24/22 222 lb (100.7 kg)  07/15/22 217 lb (98.4 kg)   BP Readings from Last 3 Encounters:  06/24/23 134/86  12/24/22 (!) 146/94  07/15/22 (!) 130/94         Past Medical History:  Diagnosis Date   Diabetes mellitus without complication (HCC)    Hyperlipidemia    Hypertension    Hypothyroidism 03/05/2016   Past Surgical History:  Procedure Laterality Date   ABDOMINAL HYSTERECTOMY  06/02/2017   (total)   TUBAL LIGATION  02/2000   WISDOM TOOTH EXTRACTION      reports that she has never smoked. She has never used smokeless tobacco. She reports that she does not drink alcohol and does not use drugs. family history includes Colon polyps in her father; Hypertension in an other family member. No Known Allergies Current Outpatient Medications on File Prior to Visit  Medication Sig Dispense Refill   acetaminophen (TYLENOL) 500 MG tablet Take 500 mg by mouth every 6 (six) hours as needed.     aspirin 81 MG EC tablet Take 1 tablet (81 mg total) by mouth daily. Swallow whole. 30 tablet 12   cetirizine (ZYRTEC) 10 MG tablet Take 10 mg by mouth daily.     cyclobenzaprine (FLEXERIL) 5 MG tablet Take 1 tablet (5 mg total) by mouth 3 (three) times daily as needed for muscle spasms. 40 tablet 1   estradiol (ESTRACE) 1 MG tablet Take 1 mg by mouth daily.     meloxicam (MOBIC) 15 MG tablet Take 1 tablet by mouth daily.      rosuvastatin (CRESTOR) 20 MG tablet TAKE 1 TABLET BY MOUTH EVERY DAY 90 tablet 2   temazepam (RESTORIL) 15 MG capsule Take 1 capsule (15 mg total) by mouth at bedtime as needed for sleep. 10 capsule 0   traMADol (ULTRAM) 50 MG tablet Take 1 tablet (50 mg total) by mouth every 6 (six) hours as needed. 30 tablet 0   VITAMIN D PO Take 2 tablets by mouth daily.     No current facility-administered medications on file prior to visit.        ROS:  All others reviewed and negative.  Objective        PE:  BP 134/86 (BP Location: Right Arm, Patient Position: Sitting, Cuff Size: Large)   Pulse 70   Temp 98.2 F (36.8 C) (Oral)   Ht 5\' 3"  (1.6 m)   Wt 219 lb (99.3 kg)   LMP 01/07/2012   SpO2 94%   BMI 38.79 kg/m                 Constitutional: Pt appears in NAD               HENT: Head: NCAT.  Right Ear: External ear normal.                 Left Ear: External ear normal.                Eyes: . Pupils are equal, round, and reactive to light. Conjunctivae and EOM are normal               Nose: without d/c or deformity               Neck: Neck supple. Gross normal ROM               Cardiovascular: Normal rate and regular rhythm.                 Pulmonary/Chest: Effort normal and breath sounds without rales or wheezing.                Abd:  Soft, NT, ND, + BS, no organomegaly               Neurological: Pt is alert. At baseline orientation, motor grossly intact               Skin: Skin is warm. No rashes, no other new lesions, LE edema - none               Psychiatric: Pt behavior is normal without agitation   Micro: none  Cardiac tracings I have personally interpreted today:  none  Pertinent Radiological findings (summarize): none   Lab Results  Component Value Date   WBC 6.8 01/03/2023   HGB 14.7 01/03/2023   HCT 43.0 01/03/2023   PLT 242.0 01/03/2023   GLUCOSE 90 06/20/2023   CHOL 154 06/20/2023   TRIG 136.0 06/20/2023   HDL 52.20 06/20/2023   LDLDIRECT 172.9  06/30/2014   LDLCALC 75 06/20/2023   ALT 13 06/20/2023   AST 14 06/20/2023   NA 139 06/20/2023   K 4.1 06/20/2023   CL 105 06/20/2023   CREATININE 0.85 06/20/2023   BUN 13 06/20/2023   CO2 26 06/20/2023   TSH 1.86 01/03/2023   HGBA1C 5.7 06/20/2023   MICROALBUR 1.6 01/03/2023   Assessment/Plan:  Michelle Davis is a 47 y.o. White or Caucasian [1] female with  has a past medical history of Diabetes mellitus without complication (HCC), Hyperlipidemia, Hypertension, and Hypothyroidism (03/05/2016).  Diabetes (HCC) Lab Results  Component Value Date   HGBA1C 5.7 06/20/2023   With persistent uncontrolled obesity, pt for increased mounjaro 7.5 mg weekly   Essential hypertension BP Readings from Last 3 Encounters:  06/24/23 134/86  12/24/22 (!) 146/94  07/15/22 (!) 130/94   Stable, pt to continue medical treatment norvasc 5 mg every day, avapro 150 mg qd   Hyperlipidemia Lab Results  Component Value Date   LDLCALC 75 06/20/2023   Uncontrolled, goal ld < 70, pt to continue current statin crestor 20 every day and lower chol diet, declines change for now   Vitamin D deficiency Last vitamin D Lab Results  Component Value Date   VD25OH 57.23 06/20/2023   Stable, cont oral replacement  Followup: Return in about 6 months (around 12/25/2023).  Oliver Barre, MD 06/24/2023 8:38 PM [facility]

## 2023-06-24 NOTE — Assessment & Plan Note (Signed)
BP Readings from Last 3 Encounters:  06/24/23 134/86  12/24/22 (!) 146/94  07/15/22 (!) 130/94   Stable, pt to continue medical treatment norvasc 5 mg every day, avapro 150 mg qd

## 2023-07-18 ENCOUNTER — Other Ambulatory Visit (HOSPITAL_COMMUNITY): Payer: Self-pay

## 2023-10-13 ENCOUNTER — Other Ambulatory Visit: Payer: Self-pay

## 2024-01-05 ENCOUNTER — Other Ambulatory Visit (HOSPITAL_COMMUNITY): Payer: Self-pay

## 2024-03-01 ENCOUNTER — Other Ambulatory Visit: Payer: Self-pay

## 2024-03-01 ENCOUNTER — Encounter: Payer: Self-pay | Admitting: Pharmacist

## 2024-03-01 ENCOUNTER — Other Ambulatory Visit (HOSPITAL_COMMUNITY): Payer: Self-pay

## 2024-04-08 ENCOUNTER — Telehealth: Payer: Self-pay

## 2024-04-08 ENCOUNTER — Other Ambulatory Visit (HOSPITAL_COMMUNITY): Payer: Self-pay

## 2024-04-08 NOTE — Telephone Encounter (Signed)
PA has already been started. 

## 2024-04-08 NOTE — Telephone Encounter (Signed)
 Copied from CRM [id number]. Topic: General - Other >> Apr 08, 2024  1:56 PM Howard Macho wrote: Reason for OZH:[id number] called stating her pharmacy told her that [employer] does not want to cover her tirzepatide  (MOUNJARO ) and the pharmacy have sent paperwork to the office. Patient does not take her shot until sunday CB [phone]

## 2024-04-08 NOTE — Telephone Encounter (Signed)
 Pharmacy Patient Advocate Encounter   Received notification from Patient Pharmacy that prior authorization for Mounjaro  7.5 is required/requested.   Insurance verification completed.   The patient is insured through [employer] .   Per test claim: PA required; PA submitted to above mentioned insurance via CoverMyMeds Key/confirmation #/EOC [facility] Status is pending

## 2024-04-12 ENCOUNTER — Encounter: Payer: Self-pay | Admitting: Internal Medicine

## 2024-04-12 ENCOUNTER — Other Ambulatory Visit: Payer: Self-pay

## 2024-04-15 ENCOUNTER — Other Ambulatory Visit (HOSPITAL_COMMUNITY): Payer: Self-pay

## 2024-05-25 ENCOUNTER — Other Ambulatory Visit: Payer: Self-pay | Admitting: Internal Medicine

## 2024-06-04 ENCOUNTER — Other Ambulatory Visit: Payer: Self-pay

## 2024-06-04 ENCOUNTER — Other Ambulatory Visit: Payer: Self-pay | Admitting: Internal Medicine

## 2024-06-04 ENCOUNTER — Telehealth: Payer: Self-pay

## 2024-06-04 ENCOUNTER — Other Ambulatory Visit (HOSPITAL_COMMUNITY): Payer: Self-pay

## 2024-06-04 MED ORDER — MOUNJARO 7.5 MG/0.5ML ~~LOC~~ SOAJ
7.5000 mg | SUBCUTANEOUS | 11 refills | Status: DC
  Filled 2024-06-04: qty 2, 28d supply, fill #0

## 2024-06-04 NOTE — Telephone Encounter (Signed)
 But why do I keep getting these messages when the labs ARE ALREADY IN  - see order review tab, it is not hard

## 2024-06-04 NOTE — Telephone Encounter (Signed)
 Copied from CRM [id number]. Topic: Clinical - Medication Refill >> Jun 04, 2024  9:09 AM Viola F wrote: Medication: tirzepatide  (MOUNJARO ) 7.5 MG/0.5ML Pen [[id number]]  Has the patient contacted their pharmacy? Yes (Agent: If no, request that the patient contact the pharmacy for the refill. If patient does not wish to contact the pharmacy document the reason why and proceed with request.) (Agent: If yes, when and what did the pharmacy advise?)  This is the patient's preferred pharmacy:   [facility] [address], [address] [place] [address] Phone: [phone] Fax: [phone]  Is this the correct pharmacy for this prescription? Yes If no, delete pharmacy and type the correct one.   Has the prescription been filled recently? Yes  Is the patient out of the medication? Yes  Has the patient been seen for an appointment in the last year OR does the patient have an upcoming appointment? Yes  Can we respond through MyChart? Yes  Agent: Please be advised that Rx refills may take up to 3 business days. We ask that you follow-up with your pharmacy.

## 2024-06-04 NOTE — Telephone Encounter (Signed)
 Please note that there is Expired date for the orders  Easy to tell if labs orders are still active   thanks

## 2024-06-04 NOTE — Telephone Encounter (Signed)
 Copied from CRM #[id number]. Topic: Clinical - Request for Lab/Test Order >> Jun 04, 2024  9:11 AM Viola FALCON wrote: Reason for CRM: Patient scheduled lab appt for physical 06/22/24 and needs orders put in

## 2024-06-22 ENCOUNTER — Other Ambulatory Visit

## 2024-06-22 ENCOUNTER — Other Ambulatory Visit (INDEPENDENT_AMBULATORY_CARE_PROVIDER_SITE_OTHER)

## 2024-06-22 DIAGNOSIS — E559 Vitamin D deficiency, unspecified: Secondary | ICD-10-CM

## 2024-06-22 DIAGNOSIS — E538 Deficiency of other specified B group vitamins: Secondary | ICD-10-CM | POA: Diagnosis not present

## 2024-06-22 DIAGNOSIS — E1165 Type 2 diabetes mellitus with hyperglycemia: Secondary | ICD-10-CM

## 2024-06-22 LAB — BASIC METABOLIC PANEL WITH GFR
BUN: 13 mg/dL (ref 6–23)
CO2: 25 meq/L (ref 19–32)
Calcium: 8.8 mg/dL (ref 8.4–10.5)
Chloride: 104 meq/L (ref 96–112)
Creatinine, Ser: 0.8 mg/dL (ref 0.40–1.20)
GFR: 85.95 mL/min (ref >60.00)
Glucose, Bld: 98 mg/dL (ref 70–99)
Potassium: 4.1 meq/L (ref 3.5–5.1)
Sodium: 141 meq/L (ref 135–145)

## 2024-06-22 LAB — LIPID PANEL
Cholesterol: 156 mg/dL (ref 0–200)
HDL: 61.5 mg/dL (ref >39.00)
LDL Cholesterol: 69 mg/dL (ref 0–99)
NonHDL: 94.53
Total CHOL/HDL Ratio: 3
Triglycerides: 128 mg/dL (ref 0.0–149.0)
VLDL: 25.6 mg/dL (ref 0.0–40.0)

## 2024-06-22 LAB — HEPATIC FUNCTION PANEL
ALT: 13 U/L (ref 0–35)
AST: 17 U/L (ref 0–37)
Albumin: 4 g/dL (ref 3.5–5.2)
Alkaline Phosphatase: 46 U/L (ref 39–117)
Bilirubin, Direct: 0.1 mg/dL (ref 0.0–0.3)
Total Bilirubin: 0.3 mg/dL (ref 0.2–1.2)
Total Protein: 7.2 g/dL (ref 6.0–8.3)

## 2024-06-22 LAB — CBC WITH DIFFERENTIAL/PLATELET
Basophils Absolute: 0 K/uL (ref 0.0–0.1)
Basophils Relative: 0.5 % (ref 0.0–3.0)
Eosinophils Absolute: 0.6 K/uL (ref 0.0–0.7)
Eosinophils Relative: 7 % — ABNORMAL HIGH (ref 0.0–5.0)
HCT: 45.2 % (ref 36.0–46.0)
Hemoglobin: 14.9 g/dL (ref 12.0–15.0)
Lymphocytes Relative: 41.6 % (ref 12.0–46.0)
Lymphs Abs: 3.7 K/uL (ref 0.7–4.0)
MCHC: 32.9 g/dL (ref 30.0–36.0)
MCV: 90.8 fl (ref 78.0–100.0)
Monocytes Absolute: 0.9 K/uL (ref 0.1–1.0)
Monocytes Relative: 9.6 % (ref 3.0–12.0)
Neutro Abs: 3.7 K/uL (ref 1.4–7.7)
Neutrophils Relative %: 41.3 % — ABNORMAL LOW (ref 43.0–77.0)
Platelets: 252 K/uL (ref 150.0–400.0)
RBC: 4.98 Mil/uL (ref 3.87–5.11)
RDW: 12.5 % (ref 11.5–15.5)
WBC: 8.8 K/uL (ref 4.0–10.5)

## 2024-06-22 LAB — MICROALBUMIN / CREATININE URINE RATIO
Creatinine,U: 333.8 mg/dL
Microalb Creat Ratio: 5.9 mg/g (ref 0.0–30.0)
Microalb, Ur: 2 mg/dL — ABNORMAL HIGH (ref 0.0–1.9)

## 2024-06-22 LAB — VITAMIN D 25 HYDROXY (VIT D DEFICIENCY, FRACTURES): VITD: 43.9 ng/mL (ref 30.00–100.00)

## 2024-06-22 LAB — VITAMIN B12: Vitamin B-12: 197 pg/mL — ABNORMAL LOW (ref 211–911)

## 2024-06-22 LAB — TSH: TSH: 4.2 u[IU]/mL (ref 0.35–5.50)

## 2024-06-22 LAB — HEMOGLOBIN A1C: Hgb A1c MFr Bld: 6 % (ref 4.6–6.5)

## 2024-06-23 ENCOUNTER — Encounter: Admitting: Internal Medicine

## 2024-06-23 LAB — URINALYSIS, ROUTINE W REFLEX MICROSCOPIC
Bilirubin Urine: NEGATIVE
Ketones, ur: NEGATIVE
Leukocytes,Ua: NEGATIVE
Nitrite: NEGATIVE
Specific Gravity, Urine: 1.015 (ref 1.000–1.030)
Total Protein, Urine: NEGATIVE
Urine Glucose: NEGATIVE
Urobilinogen, UA: 0.2 (ref 0.0–1.0)
pH: 6 (ref 5.0–8.0)

## 2024-06-25 ENCOUNTER — Ambulatory Visit (INDEPENDENT_AMBULATORY_CARE_PROVIDER_SITE_OTHER)

## 2024-06-25 ENCOUNTER — Other Ambulatory Visit (HOSPITAL_COMMUNITY): Payer: Self-pay

## 2024-06-25 ENCOUNTER — Telehealth: Payer: Self-pay | Admitting: Pharmacy Technician

## 2024-06-25 ENCOUNTER — Ambulatory Visit: Admitting: Internal Medicine

## 2024-06-25 VITALS — BP 136/90 | HR 80 | Temp 97.7°F | Ht 63.0 in | Wt 221.6 lb

## 2024-06-25 DIAGNOSIS — E1165 Type 2 diabetes mellitus with hyperglycemia: Secondary | ICD-10-CM | POA: Diagnosis not present

## 2024-06-25 DIAGNOSIS — R062 Wheezing: Secondary | ICD-10-CM

## 2024-06-25 DIAGNOSIS — Z0001 Encounter for general adult medical examination with abnormal findings: Secondary | ICD-10-CM | POA: Diagnosis not present

## 2024-06-25 DIAGNOSIS — E538 Deficiency of other specified B group vitamins: Secondary | ICD-10-CM | POA: Diagnosis not present

## 2024-06-25 DIAGNOSIS — Z7985 Long-term (current) use of injectable non-insulin antidiabetic drugs: Secondary | ICD-10-CM

## 2024-06-25 DIAGNOSIS — E039 Hypothyroidism, unspecified: Secondary | ICD-10-CM

## 2024-06-25 DIAGNOSIS — L989 Disorder of the skin and subcutaneous tissue, unspecified: Secondary | ICD-10-CM

## 2024-06-25 DIAGNOSIS — E559 Vitamin D deficiency, unspecified: Secondary | ICD-10-CM

## 2024-06-25 DIAGNOSIS — I10 Essential (primary) hypertension: Secondary | ICD-10-CM

## 2024-06-25 DIAGNOSIS — E7800 Pure hypercholesterolemia, unspecified: Secondary | ICD-10-CM

## 2024-06-25 MED ORDER — AMLODIPINE BESYLATE 5 MG PO TABS: 5.0000 mg | ORAL_TABLET | Freq: Every day | ORAL | 3 refills | Status: AC

## 2024-06-25 MED ORDER — PREDNISONE 10 MG PO TABS: ORAL_TABLET | ORAL | 0 refills | Status: DC

## 2024-06-25 MED ORDER — LEVOTHYROXINE SODIUM 88 MCG PO TABS: 88.0000 ug | ORAL_TABLET | Freq: Every day | ORAL | 3 refills | Status: AC

## 2024-06-25 MED ORDER — ASPIRIN 81 MG PO TBEC: 81.0000 mg | DELAYED_RELEASE_TABLET | Freq: Every day | ORAL | 12 refills | Status: AC

## 2024-06-25 MED ORDER — TIRZEPATIDE 10 MG/0.5ML ~~LOC~~ SOAJ
10.0000 mg | SUBCUTANEOUS | 3 refills | Status: AC
  Filled 2024-06-25 – 2024-07-02 (×3): qty 2, 28d supply, fill #0
  Filled 2024-07-28: qty 2, 28d supply, fill #1
  Filled 2024-08-27: qty 2, 28d supply, fill #2
  Filled 2024-09-22: qty 2, 28d supply, fill #3
  Filled 2024-10-20: qty 2, 28d supply, fill #4
  Filled 2024-11-19: qty 2, 28d supply, fill #5

## 2024-06-25 MED ORDER — ALBUTEROL SULFATE HFA 108 (90 BASE) MCG/ACT IN AERS: 2.0000 | INHALATION_SPRAY | Freq: Four times a day (QID) | RESPIRATORY_TRACT | 1 refills | Status: DC | PRN

## 2024-06-25 MED ORDER — IRBESARTAN 150 MG PO TABS: 150.0000 mg | ORAL_TABLET | Freq: Every day | ORAL | 3 refills | Status: AC

## 2024-06-25 MED ORDER — TIRZEPATIDE 10 MG/0.5ML ~~LOC~~ SOAJ
10.0000 mg | SUBCUTANEOUS | 3 refills | Status: DC
Start: 2024-06-25 — End: 2024-06-25

## 2024-06-25 NOTE — Progress Notes (Signed)
 Patient ID: Michelle Davis, female   DOB: 1976-04-03, 48 y.o.   MRN: [id number]         Chief Complaint:: wellness exam and low b12, dm, obesity, wheezing, htn, skin lesion right index finger        HPI:  Michelle Davis is a 48 y.o. female here for wellness exam; declines all vax today, plans to see optho and GYN soon with mammogram wno that she is 48.  Hard to lose further wt,  Has incidental mild sob and wheezing last 3 days wtihout hx of cardiopulm disease.  Also has skin lesion, right index finger just distal to the PIP, raised warty firm nontender but persistent for months.   Also did have wt loss at one point to 212 but regained some.  Asking for increased mounjaro .  BP has been controlled at home   Wt Readings from Last 3 Encounters:  06/25/24 221 lb 9.6 oz (100.5 kg)  06/24/23 219 lb (99.3 kg)  12/24/22 222 lb (100.7 kg)   BP Readings from Last 3 Encounters:  06/25/24 (!) 136/90  06/24/23 134/86  12/24/22 (!) 146/94   Immunization History  Administered Date(s) Administered   PFIZER(Purple Top)SARS-COV-2 Vaccination 12/02/2020   Tdap 12/24/2022   Health Maintenance Due  Topic Date Due   Pneumococcal Vaccine: 50+ Years (1 of 2 - PCV) Never done   Hepatitis B Vaccines 19-59 Average Risk (1 of 3 - 19+ 3-dose series) Never done   OPHTHALMOLOGY EXAM  08/29/2020   COVID-19 Vaccine (3 - 2024-25 season) 06/29/2023   MAMMOGRAM  Never done   Zoster Vaccines- Shingrix (1 of 2) Never done      Past Medical History:  Diagnosis Date   Diabetes mellitus without complication (HCC)    Hyperlipidemia    Hypertension    Hypothyroidism 03/05/2016   Past Surgical History:  Procedure Laterality Date   ABDOMINAL HYSTERECTOMY  06/02/2017   (total)   TUBAL LIGATION  02/2000   WISDOM TOOTH EXTRACTION      reports that she has never smoked. She has never used smokeless tobacco. She reports that she does not drink alcohol and does not use drugs. family history includes Colon polyps in  her father; Hypertension in an other family member. No Known Allergies Current Outpatient Medications on File Prior to Visit  Medication Sig Dispense Refill   acetaminophen (TYLENOL) 500 MG tablet Take 500 mg by mouth every 6 (six) hours as needed.     cetirizine (ZYRTEC) 10 MG tablet Take 10 mg by mouth daily.     cyclobenzaprine  (FLEXERIL ) 5 MG tablet Take 1 tablet (5 mg total) by mouth 3 (three) times daily as needed for muscle spasms. 40 tablet 1   estradiol (ESTRACE) 1 MG tablet Take 1 mg by mouth daily.     rosuvastatin  (CRESTOR ) 20 MG tablet TAKE 1 TABLET BY MOUTH EVERY DAY 90 tablet 0   VITAMIN D  PO Take 2 tablets by mouth daily.     No current facility-administered medications on file prior to visit.        ROS:  All others reviewed and negative.  Objective        PE:  BP (!) 136/90   Pulse 80   Temp 97.7 F (36.5 C)   Ht 5' 3 (1.6 m)   Wt 221 lb 9.6 oz (100.5 kg)   LMP 01/07/2012   SpO2 96%   BMI 39.25 kg/m  Constitutional: Pt appears in NAD               HENT: Head: NCAT.                Right Ear: External ear normal.                 Left Ear: External ear normal.                Eyes: . Pupils are equal, round, and reactive to light. Conjunctivae and EOM are normal               Nose: without d/c or deformity               Neck: Neck supple. Gross normal ROM               Cardiovascular: Normal rate and regular rhythm.                 Pulmonary/Chest: Effort normal and breath sounds without rales  but with few bilateral wheezing rhonchi left > right.                Abd:  Soft, NT, ND, + BS, no organomegaly               Neurological: Pt is alert. At baseline orientation, motor grossly intact               Skin: Skin is warm. No rashes, warty skin lesion noted right index finger,, LE edema - none               Psychiatric: Pt behavior is normal without agitation   Micro: none  Cardiac tracings I have personally interpreted today:   none  Pertinent Radiological findings (summarize): none   Lab Results  Component Value Date   WBC 8.8 06/22/2024   HGB 14.9 06/22/2024   HCT 45.2 06/22/2024   PLT 252.0 06/22/2024   GLUCOSE 98 06/22/2024   CHOL 156 06/22/2024   TRIG 128.0 06/22/2024   HDL 61.50 06/22/2024   LDLDIRECT 172.9 06/30/2014   LDLCALC 69 06/22/2024   ALT 13 06/22/2024   AST 17 06/22/2024   NA 141 06/22/2024   K 4.1 06/22/2024   CL 104 06/22/2024   CREATININE 0.80 06/22/2024   BUN 13 06/22/2024   CO2 25 06/22/2024   TSH 4.20 06/22/2024   HGBA1C 6.0 06/22/2024   MICROALBUR 2.0 (H) 06/22/2024   Assessment/Plan:  Michelle Davis is a 47 y.o. White or Caucasian [1] female with  has a past medical history of Diabetes mellitus without complication (HCC), Hyperlipidemia, Hypertension, and Hypothyroidism (03/05/2016).  Encounter for well adult exam with abnormal findings Age and sex appropriate education and counseling updated with regular exercise and diet Referrals for preventative services - pt states will f/u with optho, GYN and mammogram soon Immunizations addressed - declines all Smoking counseling  - none needed Evidence for depression or other mood disorder - none significant Most recent labs reviewed. I have personally reviewed and have noted: 1) the patient's medical and social history 2) The patient's current medications and supplements 3) The patient's height, weight, and BMI have been recorded in the chart   [facility]) Lab Results  Component Value Date   HGBA1C 6.0 06/22/2024   With uncontrolled obesity, pt to continue current medical treatment   - for increased mounjaro  10 mg qd  Essential hypertension BP Readings from Last 3 Encounters:  06/25/24 (!) 136/90  06/24/23 134/86  12/24/22 (!) 146/94   Uncontrolled here, but pt states controlled at home,, pt to continue medical treatment norvasc  5 every day, avapro  150 mg qd   Hyperlipidemia Lab Results  Component Value  Date   LDLCALC 69 06/22/2024   Stable, pt to continue current statin crestor  20 mg qd   Hypothyroidism Lab Results  Component Value Date   TSH 4.20 06/22/2024   Stable, pt to continue levothyroxine  88 mcg qd  Vitamin D  deficiency Last vitamin D  Lab Results  Component Value Date   VD25OH 43.90 06/22/2024   Stable, cont oral replacement   B12 deficiency Lab Results  Component Value Date   VITAMINB12 197 (L) 06/22/2024   Low, to start oral replacement - b12 1000 mcg qd   Skin lesion of hand Warty lesion, for derm referral  Wheezing Incidental mild, suspect pt has mild underlying reactive airways, for prednisone  taper, albuterol  hfa prn, and cxr  Followup: Return in about 1 year (around 06/25/2025).  Lynwood Rush, MD 06/26/2024 9:48 PM [facility]

## 2024-06-25 NOTE — Patient Instructions (Signed)
 Please take OTC B12 1000 mcg per day for 6 months only  Please take all new medication as prescribed - the prednisone , and inhaler as needed  Ok to increase the Mounjaro  to 10 mg weekly  Please continue all other medications as before, and refills have been done if requested.  Please have the pharmacy call with any other refills you may need.  Please continue your efforts at being more active, low cholesterol diet, and weight control.  You are otherwise up to date with prevention measures today.  Please keep your appointments with your specialists as you may have planned  You will be contacted regarding the referral for: Dermatology  Please go to the XRAY Department in the first floor for the x-ray testing  You will be contacted by phone if any changes need to be made immediately.  Otherwise, you will receive a letter about your results with an explanation, but please check with MyChart first.  Please make an Appointment to return for your 1 year visit, or sooner if needed, with Lab testing by Appointment as well, to be done about 3-5 days before at the FIRST FLOOR Lab (so this is for TWO appointments - please see the scheduling desk as you leave)

## 2024-06-25 NOTE — Telephone Encounter (Signed)
 Pharmacy Patient Advocate Encounter   Received notification from CoverMyMeds that prior authorization for Mounjaro  10MG /0.5ML auto-injectors  is required/requested.   Insurance verification completed.   The patient is insured through [employer] .   Per test claim: PA required; PA submitted to above mentioned insurance via Latent Key/confirmation #/EOC [id number] Status is pending

## 2024-06-26 ENCOUNTER — Ambulatory Visit: Payer: Self-pay | Admitting: Internal Medicine

## 2024-06-26 ENCOUNTER — Encounter: Payer: Self-pay | Admitting: Internal Medicine

## 2024-06-26 DIAGNOSIS — E538 Deficiency of other specified B group vitamins: Secondary | ICD-10-CM | POA: Insufficient documentation

## 2024-06-26 DIAGNOSIS — L989 Disorder of the skin and subcutaneous tissue, unspecified: Secondary | ICD-10-CM | POA: Insufficient documentation

## 2024-06-26 DIAGNOSIS — R062 Wheezing: Secondary | ICD-10-CM | POA: Insufficient documentation

## 2024-06-26 NOTE — Assessment & Plan Note (Signed)
 Age and sex appropriate education and counseling updated with regular exercise and diet Referrals for preventative services - pt states will f/u with optho, GYN and mammogram soon Immunizations addressed - declines all Smoking counseling  - none needed Evidence for depression or other mood disorder - none significant Most recent labs reviewed. I have personally reviewed and have noted: 1) the patient's medical and social history 2) The patient's current medications and supplements 3) The patient's height, weight, and BMI have been recorded in the chart

## 2024-06-26 NOTE — Assessment & Plan Note (Signed)
 Lab Results  Component Value Date   TSH 4.20 06/22/2024   Stable, pt to continue levothyroxine  88 mcg qd

## 2024-06-26 NOTE — Assessment & Plan Note (Signed)
 Last vitamin D  Lab Results  Component Value Date   VD25OH 43.90 06/22/2024   Stable, cont oral replacement

## 2024-06-26 NOTE — Assessment & Plan Note (Addendum)
 Lab Results  Component Value Date   HGBA1C 6.0 06/22/2024   With uncontrolled obesity, pt to continue current medical treatment   - for increased mounjaro  10 mg qd

## 2024-06-26 NOTE — Assessment & Plan Note (Signed)
 Lab Results  Component Value Date   LDLCALC 69 06/22/2024   Stable, pt to continue current statin crestor  20 mg qd

## 2024-06-26 NOTE — Assessment & Plan Note (Signed)
 Warty lesion, for derm referral

## 2024-06-26 NOTE — Assessment & Plan Note (Signed)
 Lab Results  Component Value Date   VITAMINB12 197 (L) 06/22/2024   Low, to start oral replacement - b12 1000 mcg qd

## 2024-06-26 NOTE — Assessment & Plan Note (Signed)
 Incidental mild, suspect pt has mild underlying reactive airways, for prednisone  taper, albuterol  hfa prn, and cxr

## 2024-06-26 NOTE — Assessment & Plan Note (Signed)
 BP Readings from Last 3 Encounters:  06/25/24 (!) 136/90  06/24/23 134/86  12/24/22 (!) 146/94   Uncontrolled here, but pt states controlled at home,, pt to continue medical treatment norvasc  5 every day, avapro  150 mg qd

## 2024-06-29 ENCOUNTER — Other Ambulatory Visit (HOSPITAL_COMMUNITY): Payer: Self-pay

## 2024-07-02 ENCOUNTER — Other Ambulatory Visit (HOSPITAL_BASED_OUTPATIENT_CLINIC_OR_DEPARTMENT_OTHER): Payer: Self-pay

## 2024-07-02 ENCOUNTER — Other Ambulatory Visit (HOSPITAL_COMMUNITY): Payer: Self-pay

## 2024-07-02 NOTE — Telephone Encounter (Signed)
 Pharmacy Patient Advocate Encounter  Received notification from [employer] that Prior Authorization for Mounjaro  10MG /0.5ML auto-injectors  has been APPROVED from 05/26/24 to 06/29/25. Ran test claim, Copay is $25.00. This test claim was processed through [facility]- copay amounts may vary at other pharmacies due to pharmacy/plan contracts, or as the patient moves through the different stages of their insurance plan.   PA #/Case ID/Reference #: [id number]

## 2024-07-28 ENCOUNTER — Other Ambulatory Visit: Payer: Self-pay

## 2024-08-27 ENCOUNTER — Other Ambulatory Visit (HOSPITAL_COMMUNITY): Payer: Self-pay

## 2024-08-28 ENCOUNTER — Other Ambulatory Visit: Payer: Self-pay | Admitting: Internal Medicine

## 2024-08-30 ENCOUNTER — Other Ambulatory Visit: Payer: Self-pay

## 2024-09-22 ENCOUNTER — Other Ambulatory Visit (HOSPITAL_COMMUNITY): Payer: Self-pay

## 2024-09-27 ENCOUNTER — Encounter: Payer: Self-pay | Admitting: Internal Medicine

## 2024-09-27 MED ORDER — PREDNISONE 10 MG PO TABS: ORAL_TABLET | ORAL | 0 refills | Status: AC

## 2024-09-27 MED ORDER — BUDESONIDE-FORMOTEROL FUMARATE 80-4.5 MCG/ACT IN AERO: 2.0000 | INHALATION_SPRAY | Freq: Two times a day (BID) | RESPIRATORY_TRACT | 3 refills | Status: AC

## 2024-10-20 ENCOUNTER — Other Ambulatory Visit (HOSPITAL_COMMUNITY): Payer: Self-pay

## 2024-11-12 ENCOUNTER — Other Ambulatory Visit: Payer: Self-pay | Admitting: Internal Medicine

## 2024-11-12 ENCOUNTER — Other Ambulatory Visit: Payer: Self-pay

## 2024-11-19 ENCOUNTER — Other Ambulatory Visit (HOSPITAL_COMMUNITY): Payer: Self-pay

## 2024-11-22 ENCOUNTER — Other Ambulatory Visit: Payer: Self-pay | Admitting: Internal Medicine

## 2024-11-23 ENCOUNTER — Other Ambulatory Visit: Payer: Self-pay

## 2025-06-30 ENCOUNTER — Encounter: Admitting: Internal Medicine
